# Patient Record
Sex: Female | Born: 1980 | Race: White | Hispanic: No | Marital: Married | Smoking: Former smoker
Health system: Southern US, Community
[De-identification: ages and names within clinical notes are randomized; demographics above are authoritative.]

## PROBLEM LIST (undated history)

## (undated) DIAGNOSIS — N63 Unspecified lump in unspecified breast: Secondary | ICD-10-CM

## (undated) DIAGNOSIS — R519 Headache, unspecified: Secondary | ICD-10-CM

## (undated) DIAGNOSIS — K219 Gastro-esophageal reflux disease without esophagitis: Secondary | ICD-10-CM

## (undated) DIAGNOSIS — N189 Chronic kidney disease, unspecified: Secondary | ICD-10-CM

## (undated) DIAGNOSIS — R87629 Unspecified abnormal cytological findings in specimens from vagina: Secondary | ICD-10-CM

## (undated) DIAGNOSIS — Z87442 Personal history of urinary calculi: Secondary | ICD-10-CM

## (undated) DIAGNOSIS — R87619 Unspecified abnormal cytological findings in specimens from cervix uteri: Secondary | ICD-10-CM

## (undated) DIAGNOSIS — M797 Fibromyalgia: Secondary | ICD-10-CM

## (undated) DIAGNOSIS — IMO0002 Reserved for concepts with insufficient information to code with codable children: Secondary | ICD-10-CM

## (undated) DIAGNOSIS — R51 Headache: Secondary | ICD-10-CM

## (undated) DIAGNOSIS — E039 Hypothyroidism, unspecified: Secondary | ICD-10-CM

## (undated) DIAGNOSIS — E890 Postprocedural hypothyroidism: Secondary | ICD-10-CM

## (undated) HISTORY — PX: TUBAL LIGATION: SHX77

## (undated) HISTORY — PX: BREAST BIOPSY: SHX20

## (undated) HISTORY — DX: Unspecified abnormal cytological findings in specimens from vagina: R87.629

---
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
## 2010-12-15 LAB — ABO/RH

## 2010-12-15 LAB — ANTIBODY SCREEN: Antibody Screen: NEGATIVE

## 2010-12-15 LAB — RUBELLA ANTIBODY, IGM: Rubella: IMMUNE

## 2011-04-01 LAB — RPR: RPR: NONREACTIVE

## 2011-04-01 LAB — HIV ANTIBODY (ROUTINE TESTING W REFLEX): HIV: NONREACTIVE

## 2011-06-23 ENCOUNTER — Inpatient Hospital Stay (HOSPITAL_COMMUNITY): Payer: BC Managed Care – PPO | Admitting: Anesthesiology

## 2011-06-23 ENCOUNTER — Encounter (HOSPITAL_COMMUNITY): Payer: Self-pay | Admitting: *Deleted

## 2011-06-23 ENCOUNTER — Inpatient Hospital Stay (HOSPITAL_COMMUNITY)
Admission: AD | Admit: 2011-06-23 | Discharge: 2011-06-25 | DRG: 373 | Disposition: A | Discharge status: Home / Self Care | Payer: BC Managed Care – PPO | Source: Ambulatory Visit | Attending: Obstetrics and Gynecology | Admitting: Obstetrics and Gynecology

## 2011-06-23 ENCOUNTER — Encounter (HOSPITAL_COMMUNITY): Payer: Self-pay | Admitting: Anesthesiology

## 2011-06-23 HISTORY — DX: Unspecified abnormal cytological findings in specimens from cervix uteri: R87.619

## 2011-06-23 HISTORY — DX: Headache, unspecified: R51.9

## 2011-06-23 HISTORY — DX: Reserved for concepts with insufficient information to code with codable children: IMO0002

## 2011-06-23 HISTORY — DX: Headache: R51

## 2011-06-23 LAB — CBC
HCT: 35.8 % — ABNORMAL LOW (ref 36.0–46.0)
MCH: 32.1 pg (ref 26.0–34.0)
MCV: 93.5 fL (ref 78.0–100.0)
Platelets: 211 10*3/uL (ref 150–400)
RBC: 3.83 MIL/uL — ABNORMAL LOW (ref 3.87–5.11)
WBC: 10.8 10*3/uL — ABNORMAL HIGH (ref 4.0–10.5)

## 2011-06-23 LAB — HIV ANTIBODY (ROUTINE TESTING W REFLEX): HIV: NONREACTIVE

## 2011-06-23 MED ORDER — PRENATAL PLUS 27-1 MG PO TABS
1.0000 | ORAL_TABLET | Freq: Every day | ORAL | Status: DC
  Administered 2011-06-24 – 2011-06-25 (×2): 1 via ORAL
  Filled 2011-06-23 (×2): qty 1

## 2011-06-23 MED ORDER — MEASLES, MUMPS & RUBELLA VAC ~~LOC~~ INJ: 0.5000 mL | INJECTION | Freq: Once | SUBCUTANEOUS | Status: DC

## 2011-06-23 MED ORDER — IBUPROFEN 600 MG PO TABS: 600.0000 mg | ORAL_TABLET | Freq: Four times a day (QID) | ORAL | Status: DC | PRN

## 2011-06-23 MED ORDER — OXYTOCIN 10 UNIT/ML IJ SOLN
INTRAMUSCULAR | Status: AC
  Filled 2011-06-23: qty 2

## 2011-06-23 MED ORDER — SENNOSIDES-DOCUSATE SODIUM 8.6-50 MG PO TABS
2.0000 | ORAL_TABLET | Freq: Every day | ORAL | Status: DC
  Administered 2011-06-24: 2 via ORAL

## 2011-06-23 MED ORDER — LACTATED RINGERS IV SOLN: 500.0000 mL | INTRAVENOUS | Status: DC | PRN

## 2011-06-23 MED ORDER — OXYCODONE-ACETAMINOPHEN 5-325 MG PO TABS
1.0000 | ORAL_TABLET | Freq: Four times a day (QID) | ORAL | Status: DC | PRN
  Filled 2011-06-23: qty 1

## 2011-06-23 MED ORDER — LACTATED RINGERS IV SOLN
INTRAVENOUS | Status: DC
  Administered 2011-06-23 (×3): via INTRAVENOUS

## 2011-06-23 MED ORDER — OXYTOCIN 20 UNITS IN LACTATED RINGERS INFUSION - SIMPLE
1.0000 m[IU]/min | INTRAVENOUS | Status: DC
  Administered 2011-06-23: 2 m[IU]/min via INTRAVENOUS
  Filled 2011-06-23: qty 1000

## 2011-06-23 MED ORDER — TETANUS-DIPHTH-ACELL PERTUSSIS 5-2.5-18.5 LF-MCG/0.5 IM SUSP
0.5000 mL | Freq: Once | INTRAMUSCULAR | Status: AC
  Administered 2011-06-24: 0.5 mL via INTRAMUSCULAR
  Filled 2011-06-23: qty 0.5

## 2011-06-23 MED ORDER — TERBUTALINE SULFATE 1 MG/ML IJ SOLN: 0.2500 mg | Freq: Once | INTRAMUSCULAR | Status: DC | PRN

## 2011-06-23 MED ORDER — BENZOCAINE-MENTHOL 20-0.5 % EX AERO
1.0000 "application " | INHALATION_SPRAY | CUTANEOUS | Status: DC | PRN
  Administered 2011-06-24: 1 via TOPICAL

## 2011-06-23 MED ORDER — OXYTOCIN 20 UNITS IN LACTATED RINGERS INFUSION - SIMPLE: 125.0000 mL/h | Freq: Once | INTRAVENOUS | Status: DC

## 2011-06-23 MED ORDER — ACETAMINOPHEN 325 MG PO TABS
650.0000 mg | ORAL_TABLET | ORAL | Status: DC | PRN
  Administered 2011-06-23: 650 mg via ORAL
  Filled 2011-06-23: qty 2

## 2011-06-23 MED ORDER — OXYCODONE-ACETAMINOPHEN 5-325 MG PO TABS
1.0000 | ORAL_TABLET | Freq: Four times a day (QID) | ORAL | Status: DC | PRN
  Administered 2011-06-23 – 2011-06-24 (×2): 1 via ORAL
  Filled 2011-06-23: qty 1

## 2011-06-23 MED ORDER — PHENYLEPHRINE 40 MCG/ML (10ML) SYRINGE FOR IV PUSH (FOR BLOOD PRESSURE SUPPORT): 80.0000 ug | PREFILLED_SYRINGE | INTRAVENOUS | Status: DC | PRN

## 2011-06-23 MED ORDER — LACTATED RINGERS IV SOLN: 500.0000 mL | Freq: Once | INTRAVENOUS | Status: DC

## 2011-06-23 MED ORDER — WITCH HAZEL-GLYCERIN EX PADS: 1.0000 "application " | MEDICATED_PAD | CUTANEOUS | Status: DC | PRN

## 2011-06-23 MED ORDER — LANOLIN HYDROUS EX OINT: TOPICAL_OINTMENT | CUTANEOUS | Status: DC | PRN

## 2011-06-23 MED ORDER — ONDANSETRON HCL 4 MG/2ML IJ SOLN: 4.0000 mg | Freq: Four times a day (QID) | INTRAMUSCULAR | Status: DC | PRN

## 2011-06-23 MED ORDER — LIDOCAINE HCL (PF) 1 % IJ SOLN
30.0000 mL | INTRAMUSCULAR | Status: DC | PRN
  Filled 2011-06-23: qty 30

## 2011-06-23 MED ORDER — OXYTOCIN BOLUS FROM INFUSION
500.0000 mL | Freq: Once | INTRAVENOUS | Status: DC
Start: 2011-06-23 — End: 2011-06-23

## 2011-06-23 MED ORDER — FLEET ENEMA 7-19 GM/118ML RE ENEM: 1.0000 | ENEMA | RECTAL | Status: DC | PRN

## 2011-06-23 MED ORDER — OXYCODONE-ACETAMINOPHEN 5-325 MG PO TABS: 2.0000 | ORAL_TABLET | ORAL | Status: DC | PRN

## 2011-06-23 MED ORDER — ZOLPIDEM TARTRATE 5 MG PO TABS: 5.0000 mg | ORAL_TABLET | Freq: Every evening | ORAL | Status: DC | PRN

## 2011-06-23 MED ORDER — ONDANSETRON HCL 4 MG/2ML IJ SOLN: 4.0000 mg | INTRAMUSCULAR | Status: DC | PRN

## 2011-06-23 MED ORDER — EPHEDRINE 5 MG/ML INJ: 10.0000 mg | INTRAVENOUS | Status: DC | PRN

## 2011-06-23 MED ORDER — IBUPROFEN 600 MG PO TABS
600.0000 mg | ORAL_TABLET | Freq: Four times a day (QID) | ORAL | Status: DC
  Administered 2011-06-24 – 2011-06-25 (×5): 600 mg via ORAL
  Filled 2011-06-23 (×6): qty 1

## 2011-06-23 MED ORDER — DIPHENHYDRAMINE HCL 50 MG/ML IJ SOLN: 12.5000 mg | INTRAMUSCULAR | Status: DC | PRN

## 2011-06-23 MED ORDER — ONDANSETRON HCL 4 MG PO TABS: 4.0000 mg | ORAL_TABLET | ORAL | Status: DC | PRN

## 2011-06-23 MED ORDER — SIMETHICONE 80 MG PO CHEW: 80.0000 mg | CHEWABLE_TABLET | ORAL | Status: DC | PRN

## 2011-06-23 MED ORDER — CITRIC ACID-SODIUM CITRATE 334-500 MG/5ML PO SOLN: 30.0000 mL | ORAL | Status: DC | PRN

## 2011-06-23 MED ORDER — DIPHENHYDRAMINE HCL 25 MG PO CAPS: 25.0000 mg | ORAL_CAPSULE | Freq: Four times a day (QID) | ORAL | Status: DC | PRN

## 2011-06-23 MED ORDER — FENTANYL 2.5 MCG/ML BUPIVACAINE 1/10 % EPIDURAL INFUSION (WH - ANES)
14.0000 mL/h | INTRAMUSCULAR | Status: DC
  Administered 2011-06-23 (×3): 14 mL/h via EPIDURAL
  Filled 2011-06-23 (×3): qty 60

## 2011-06-23 MED ORDER — DIBUCAINE 1 % RE OINT: 1.0000 "application " | TOPICAL_OINTMENT | RECTAL | Status: DC | PRN

## 2011-06-23 NOTE — H&P (Signed)
Melanie Rose is a 30 y.o. female G2P0101 at 38+ weeks (EDD 07/05/11 by LMP c/w 8 wek [place])  presenting forSROM around midnight.  Admitted with contractions and uncomfortable.  Received her epidural and made some change from 2-3 cm to 4+cm per RN.  Prenatal care uneventful.  Maternal Medical History:  Reason for admission: Reason for admission: rupture of membranes.  Contractions: Onset was 3-5 hours ago.   Frequency: irregular.   Perceived severity is moderate.      OB History    Grav Para Term Preterm Abortions TAB SAB Ect Mult Living   2 1 1  0      1     Past Medical History  Diagnosis Date  . Abnormal Pap smear   . Generalized headaches   . Hemorrhoids   . Constipation    No past surgical history on file. Family History: family history is not on file. Social History:  reports that she quit smoking about 2 years ago. She does not have any smokeless tobacco history on file. She reports that she does not drink alcohol or use illicit drugs.  ROS  Dilation: 4.5 Effacement (%): 50 Station: -2 Exam by:: A.Davis,RN  Blood pressure 111/65, pulse 90, temperature 98.3 F (36.8 C), temperature source Oral, resp. rate 18, height 4\' 11"  (1.499 m), weight 83.462 kg (184 lb). Maternal Exam:  Uterine Assessment: Contraction strength is moderate.  Contraction frequency is regular.   Abdomen: Patient reports no abdominal tenderness. Fetal presentation: vertex  Introitus: Normal vulva. Normal vagina.    Physical Exam  Constitutional: She is oriented to person, place, and time. She appears well-developed.  Cardiovascular: Normal rate and regular rhythm.   Respiratory: Effort normal and breath sounds normal.  GI: Soft. Bowel sounds are normal.  Neurological: She is alert and oriented to person, place, and time.  Psychiatric: She has a normal mood and affect. Her behavior is normal.    Prenatal labs: ABO, Rh:   Antibody:   Rubella:   RPR: Nonreactive (07/26 0000)  HBsAg:    HIV:  Non-reactive (07/26 0000)  GBS: Negative (10/01 0000)  First trimester screen WNL AFP WNL One hour glucola 99  Assessment/Plan: Pt admitted and progressing slowly so will start pitocin augmentation.  Lab flow sheet is not in prenatals, so will call office to get. FHR reassuring.   Oliver Pila 06/23/2011, 7:23 AM

## 2011-06-23 NOTE — Anesthesia Procedure Notes (Signed)
Epidural   Additional Notes See paper documentation completed during Epic downtime by Dr, Malen Gauze.

## 2011-06-23 NOTE — Progress Notes (Signed)
Pt  Became active in labor at 3 PM with the pitocin at 10 mu/minute. At 5:30 pm she was 8 cm. She progressed to full dilatation and pushed well but made slow descent. She delivered a living female infant 8 pounds 3 ounces with Apgars of 9 at 1 minute and 9 at 5 minutes. There was shoulder dystocia that was managed by woodscrew and suprapubic pressure.The FOB was in the room but he was unable to assist with McRoberts because of aversion to the situation. The shoulder dystocia persisted for 30 seconds. There were 3 lacerations. A right of midline second degree, a right introital at 10 o'clock, and a left vaginal close to the urethra. All were bleeding and repaired with 3-0 vicryl.EBL 400cc's. The placenta was removed intact and the uterus was normal.

## 2011-06-23 NOTE — Anesthesia Preprocedure Evaluation (Addendum)
Anesthesia Evaluation Anesthesia Physical Anesthesia Plan  ASA: II  Anesthesia Plan: Epidural   Post-op Pain Management:    Induction:   Airway Management Planned:   Additional Equipment:   Intra-op Plan:   Post-operative Plan:   Informed Consent:   Plan Discussed with:   Anesthesia Plan Comments: (Eval completed on paper during epic downtime by Dr. Malen Gauze.)       Anesthesia Quick Evaluation

## 2011-06-24 LAB — CBC
HCT: 33.6 % — ABNORMAL LOW (ref 36.0–46.0)
Platelets: 203 10*3/uL (ref 150–400)
RBC: 3.58 MIL/uL — ABNORMAL LOW (ref 3.87–5.11)
RDW: 12.6 % (ref 11.5–15.5)
WBC: 15.7 10*3/uL — ABNORMAL HIGH (ref 4.0–10.5)

## 2011-06-24 MED ORDER — BENZOCAINE-MENTHOL 20-0.5 % EX AERO
INHALATION_SPRAY | CUTANEOUS | Status: AC
  Administered 2011-06-24: 1 via TOPICAL
  Filled 2011-06-24: qty 56

## 2011-06-24 NOTE — Anesthesia Postprocedure Evaluation (Signed)
  Anesthesia Post-op Note  Patient: Melanie Rose  Procedure(s) Performed: * No procedures listed *  Patient Location: PACU and Mother/Baby  Anesthesia Type: Epidural  Level of Consciousness: awake, alert , oriented and patient cooperative  Airway and Oxygen Therapy: Patient Spontanous Breathing  Post-op Pain: none  Post-op Assessment: Post-op Vital signs reviewed and Patient's Cardiovascular Status Stable  Post-op Vital Signs: Reviewed and stable  Complications: No apparent anesthesia complications

## 2011-06-24 NOTE — Progress Notes (Signed)
#  1 afebrile this am. Temp 100.0 last night. Will observe for now. Pt feels fine. HGB 12.3 to 11.6.

## 2011-06-25 MED ORDER — OXYCODONE-ACETAMINOPHEN 5-325 MG PO TABS: 1.0000 | ORAL_TABLET | Freq: Four times a day (QID) | ORAL | Status: AC | PRN

## 2011-06-25 NOTE — Progress Notes (Signed)
#  2 afebrile BP normal for D/C. 

## 2011-06-25 NOTE — Discharge Summary (Signed)
NAMELAKEYA, MULKA NO.:  [id number]  MEDICAL RECORD NO.:  [id number]  LOCATION:  9124                          FACILITY:  [facility]  PHYSICIAN:  Malachi Pro. Ambrose Mantle, M.D. DATE OF BIRTH:  [birth year]  DATE OF ADMISSION:  06/23/2011 DATE OF DISCHARGE:                              DISCHARGE SUMMARY   This is a 30 year old white female, para 0-1-0-1, gravida 2 at 38+ weeks gestation with [facility] July 05, 2011 by last period compatible with an 8- week ultrasound, presented with spontaneous rupture of membranes.  She was admitted with contractions and she was uncomfortable.  She received an epidural and made some change from 2-3 cm to 4 cm per the RN.  Her blood group and type was not available at the time of admission. Rubella was not available.  RPR nonreactive.  Hepatitis B surface antigen not available.  HIV nonreactive.  Group B-strep negative.  First trimester screen normal.  AFP normal.  1-hour Glucola was 99.  After admission to the hospital, the patient remained in labor and so Pitocin was started.  She became active in labor at approximately 3 p.m. with a Pitocin at 10 milliunits a minute.  At 5:30 p.m., she was 8 cm.  She progressed to full dilatation and pushed well but made slow descend. She delivered a living female infant 8 pounds 3 ounces with Apgars of 9 at 1 and 9 at 5 minutes.  There was shoulder dystocia that was managed by [facility] and suprapubic pressure.  The father of the baby was in the room, but he was unable to assist with McRoberts maneuver because of aversion to the situation.  A shoulder dystocia persisted for 30 seconds.  There were 3 lacerations, a right of midline 2nd degree, a right introital at 10 o'clock, and left vaginal close to the urethra. All were bleeding and repaired with 3-0 Vicryl.  Blood loss about 400 mL.  The placenta was removed intact and the uterus was normal. Postpartum, the patient did well, although she did have a fever of  100.0 degrees on the night of delivery.  She never became febrile again and on the 2nd postpartum day, she was ready for discharge.  Her initial hemoglobin was 12.3, hematocrit 35.8, white count 10,800, platelet count 211,000.  Followup hemoglobin was 11.6, hematocrit 33.6, platelet count 203,000.  FINAL DIAGNOSES: 1. Intrauterine pregnancy at 38 weeks with premature rupture of the     membranes. 2. Shoulder dystocia.  OPERATION:  Spontaneous delivery, vertex, repair of perineal lacerations  CONDITION:  Improved.  INSTRUCTIONS:  Include the regular discharge instruction booklet.  She is advised to return to the office in 6 weeks for followup examination. A prescription for 5/325, 20 tablets, 1 every 4-6 hours as needed for pain will be given at discharge.  I will check on the labs and see if there is anything that requires further evaluation.     Malachi Pro. Ambrose Mantle, M.D.     TFH/MEDQ  D:  06/25/2011  T:  06/25/2011  Job:  [id number]

## 2013-06-12 ENCOUNTER — Ambulatory Visit: Payer: BC Managed Care – PPO | Admitting: Internal Medicine

## 2014-04-10 DIAGNOSIS — M359 Systemic involvement of connective tissue, unspecified: Secondary | ICD-10-CM | POA: Insufficient documentation

## 2014-04-10 DIAGNOSIS — M2550 Pain in unspecified joint: Secondary | ICD-10-CM | POA: Insufficient documentation

## 2014-04-10 DIAGNOSIS — R768 Other specified abnormal immunological findings in serum: Secondary | ICD-10-CM | POA: Insufficient documentation

## 2014-04-24 DIAGNOSIS — M797 Fibromyalgia: Secondary | ICD-10-CM | POA: Insufficient documentation

## 2014-07-08 ENCOUNTER — Encounter (HOSPITAL_COMMUNITY): Payer: Self-pay | Admitting: *Deleted

## 2014-12-01 ENCOUNTER — Other Ambulatory Visit (HOSPITAL_COMMUNITY): Payer: Self-pay | Admitting: Obstetrics and Gynecology

## 2015-01-08 NOTE — Anesthesia Preprocedure Evaluation (Addendum)
Anesthesia Evaluation  Patient identified by MRN, date of birth, ID band Patient awake    Reviewed: Allergy & Precautions, H&P , Patient's Chart, lab work & pertinent test results, reviewed documented beta blocker date and time   Airway Mallampati: II  TM Distance: >3 FB Neck ROM: full    Dental no notable dental hx.    Pulmonary former smoker,  breath sounds clear to auscultation  Pulmonary exam normal       Cardiovascular Rhythm:regular Rate:Normal     Neuro/Psych    GI/Hepatic   Endo/Other    Renal/GU      Musculoskeletal   Abdominal   Peds  Hematology   Anesthesia Other Findings   Reproductive/Obstetrics                            Anesthesia Physical Anesthesia Plan  ASA: II  Anesthesia Plan: General   Post-op Pain Management:    Induction: Intravenous  Airway Management Planned: Oral ETT  Additional Equipment:   Intra-op Plan:   Post-operative Plan: Extubation in OR  Informed Consent: I have reviewed the patients History and Physical, chart, labs and discussed the procedure including the risks, benefits and alternatives for the proposed anesthesia with the patient or authorized representative who has indicated his/her understanding and acceptance.   Dental Advisory Given and Dental advisory given  Plan Discussed with: CRNA and Surgeon  Anesthesia Plan Comments: (Sl. Limited mouth opening ; may need light wand  Discussed general anesthesia, including possible nausea, instrumentation of airway, sore throat,pulmonary aspiration, etc. I asked if the were any outstanding questions, or  concerns before we proceeded. )       Anesthesia Quick Evaluation

## 2015-01-14 ENCOUNTER — Encounter (HOSPITAL_COMMUNITY)
Admission: RE | Admit: 2015-01-14 | Discharge: 2015-01-14 | Disposition: A | Discharge status: Home / Self Care | Payer: BLUE CROSS/BLUE SHIELD | Source: Ambulatory Visit | Attending: Obstetrics and Gynecology | Admitting: Obstetrics and Gynecology

## 2015-01-14 ENCOUNTER — Encounter (HOSPITAL_COMMUNITY): Payer: Self-pay

## 2015-01-14 DIAGNOSIS — Z87891 Personal history of nicotine dependence: Secondary | ICD-10-CM | POA: Diagnosis not present

## 2015-01-14 DIAGNOSIS — Z302 Encounter for sterilization: Secondary | ICD-10-CM | POA: Diagnosis present

## 2015-01-14 HISTORY — DX: Gastro-esophageal reflux disease without esophagitis: K21.9

## 2015-01-14 HISTORY — DX: Fibromyalgia: M79.7

## 2015-01-14 HISTORY — DX: Chronic kidney disease, unspecified: N18.9

## 2015-01-14 HISTORY — DX: Hypothyroidism, unspecified: E03.9

## 2015-01-14 LAB — URINALYSIS, ROUTINE W REFLEX MICROSCOPIC
Bilirubin Urine: NEGATIVE
GLUCOSE, UA: NEGATIVE mg/dL
KETONES UR: NEGATIVE mg/dL
LEUKOCYTES UA: NEGATIVE
Nitrite: NEGATIVE
Protein, ur: NEGATIVE mg/dL
Specific Gravity, Urine: 1.015 (ref 1.005–1.030)
Urobilinogen, UA: 0.2 mg/dL (ref 0.0–1.0)
pH: 6.5 (ref 5.0–8.0)

## 2015-01-14 LAB — CBC
HCT: 41.4 % (ref 36.0–46.0)
Hemoglobin: 14.5 g/dL (ref 12.0–15.0)
MCH: 32 pg (ref 26.0–34.0)
MCHC: 35 g/dL (ref 30.0–36.0)
MCV: 91.4 fL (ref 78.0–100.0)
PLATELETS: 264 10*3/uL (ref 150–400)
RBC: 4.53 MIL/uL (ref 3.87–5.11)
RDW: 12.2 % (ref 11.5–15.5)
WBC: 6.2 10*3/uL (ref 4.0–10.5)

## 2015-01-14 LAB — COMPREHENSIVE METABOLIC PANEL
ALK PHOS: 70 U/L (ref 38–126)
ALT: 15 U/L (ref 14–54)
ANION GAP: 6 (ref 5–15)
AST: 17 U/L (ref 15–41)
Albumin: 4.4 g/dL (ref 3.5–5.0)
BILIRUBIN TOTAL: 0.2 mg/dL — AB (ref 0.3–1.2)
BUN: 9 mg/dL (ref 6–20)
CO2: 28 mmol/L (ref 22–32)
Calcium: 9.7 mg/dL (ref 8.9–10.3)
Chloride: 105 mmol/L (ref 101–111)
Creatinine, Ser: 0.87 mg/dL (ref 0.44–1.00)
GFR calc Af Amer: >60 mL/min (ref >60)
Glucose, Bld: 99 mg/dL (ref 70–99)
Potassium: 4 mmol/L (ref 3.5–5.1)
Sodium: 139 mmol/L (ref 135–145)
Total Protein: 8.2 g/dL — ABNORMAL HIGH (ref 6.5–8.1)

## 2015-01-14 LAB — URINE MICROSCOPIC-ADD ON

## 2015-01-14 NOTE — Patient Instructions (Signed)
Your procedure is scheduled on:01/16/15  Enter through the Main Entrance at :6am Pick up desk phone and dial [phone] and inform us of your arrival.  Please call [phone] if you have any problems the morning of surgery.  Remember: Do not eat food or drink liquids, including water, after midnight:    You may brush your teeth the morning of surgery.  Take these meds the morning of surgery with a sip of water: Thyroid pill  DO NOT wear jewelry, eye make-up, lipstick,body lotion, or dark fingernail polish.  (Polished toes are ok) You may wear deodorant.  If you are to be admitted after surgery, leave suitcase in car until your room has been assigned. Patients discharged on the day of surgery will not be allowed to drive home. Wear loose fitting, comfortable clothes for your ride home.

## 2015-01-14 NOTE — H&P (Signed)
Melanie Rose, Melanie Rose NO.:  [id number]  MEDICAL RECORD NO.:  [id number]  LOCATION:  [facility]C                           FACILITY:  [place]  PHYSICIAN:  Lucille Passy. Ulanda Edison, M.D. DATE OF BIRTH:  10-08-1980  DATE OF ADMISSION:  01/14/2015 DATE OF DISCHARGE:                             HISTORY & PHYSICAL   PRESENT ILLNESS:  This is a 34 year old white female, para 2-0-2, who was admitted for sterilization by laparoscopy.  She understands the failure rate, probably of 1 in a 100.  She understands that she should consider this a permanent procedure.  She has been offered Essure in our office and declines.  Her periods are regular on birth control pills. She has not taken an active birth control pill for 1 week.  PAST MEDICAL HISTORY:  No known medical illnesses.  SURGICAL HISTORY:  None.  She did have a thyroid nodule onset in 2014.  ALLERGIES:  She has no known drug allergies.  SOCIAL HISTORY:  She is a former smoker.  Does not drink.  Does not take drugs.  Has 12th grade of education, is a Materials engineer.  She is married.  FAMILY HISTORY:  Maternal grandmother with disorder of the thyroid gland.  Maternal grandfather, heart disease.  MEDICATIONS:  The patient takes 88 mcg of levothyroxine a day.  She has been on Tri-Previfem.  PHYSICAL EXAMINATION:  GENERAL:  Well developed, obese white female, in no distress.  She is a healthy-appearing white female, in no distress.  VITAL SIGNS:  She is 5 feet tall, 190 pounds, has a BMI of 37.1, her blood pressure is 110/70, and pulse is 72.  EYES, NOSE, and THROAT:  Normal.  Thyroid, there is no appreciable nodule present.  LUNGS:  Clear to auscultation.  HEART:  Normal size and sounds.  No murmurs.  BREASTS:  Large without masses.  ABDOMEN:  Soft and nontender.  It is obese.  No masses are palpable. The vulva and vagina are clean.  The cervix is clean.  Uterus is retroverted, normal size.  The right ovary is palpated as a  normal-sized ovary, and the cul-de-sac beside the uterus.  ADMITTING IMPRESSION:  Voluntary sterilization, hypothyroidism.  The patient is admitted for laparoscopic tubal cauterization.  She requested, if I cannot enter the abdominal cavity by laparoscopy, she wants her tubes tied through a mini-lap incision.     Lucille Passy. Ulanda Edison, M.D.     TFH/MEDQ  D:  01/14/2015  T:  01/14/2015  Job:  [id number]

## 2015-01-16 ENCOUNTER — Ambulatory Visit (HOSPITAL_COMMUNITY): Payer: BLUE CROSS/BLUE SHIELD | Admitting: Anesthesiology

## 2015-01-16 ENCOUNTER — Encounter (HOSPITAL_COMMUNITY)
Admission: RE | Disposition: A | Discharge status: Home / Self Care | Payer: Self-pay | Source: Ambulatory Visit | Attending: Obstetrics and Gynecology

## 2015-01-16 ENCOUNTER — Ambulatory Visit (HOSPITAL_COMMUNITY)
Admission: RE | Admit: 2015-01-16 | Discharge: 2015-01-16 | Disposition: A | Discharge status: Home / Self Care | Payer: BLUE CROSS/BLUE SHIELD | Source: Ambulatory Visit | Attending: Obstetrics and Gynecology | Admitting: Obstetrics and Gynecology

## 2015-01-16 DIAGNOSIS — Z302 Encounter for sterilization: Secondary | ICD-10-CM | POA: Insufficient documentation

## 2015-01-16 DIAGNOSIS — Z87891 Personal history of nicotine dependence: Secondary | ICD-10-CM | POA: Insufficient documentation

## 2015-01-16 HISTORY — PX: LAPAROSCOPIC TUBAL LIGATION: SHX1937

## 2015-01-16 LAB — PREGNANCY, URINE: Preg Test, Ur: NEGATIVE

## 2015-01-16 SURGERY — LIGATION, FALLOPIAN TUBE, LAPAROSCOPIC
Anesthesia: General | Site: Abdomen | Laterality: Bilateral

## 2015-01-16 MED ORDER — ACETAMINOPHEN 160 MG/5ML PO SOLN
ORAL | Status: AC
  Filled 2015-01-16: qty 20.3

## 2015-01-16 MED ORDER — LACTATED RINGERS IV SOLN
INTRAVENOUS | Status: DC
  Administered 2015-01-16 (×2): via INTRAVENOUS

## 2015-01-16 MED ORDER — MIDAZOLAM HCL 2 MG/2ML IJ SOLN
INTRAMUSCULAR | Status: DC | PRN
  Administered 2015-01-16: 2 mg via INTRAVENOUS

## 2015-01-16 MED ORDER — ROCURONIUM BROMIDE 100 MG/10ML IV SOLN
INTRAVENOUS | Status: DC | PRN
  Administered 2015-01-16: 40 mg via INTRAVENOUS

## 2015-01-16 MED ORDER — KETOROLAC TROMETHAMINE 30 MG/ML IJ SOLN
INTRAMUSCULAR | Status: DC | PRN
  Administered 2015-01-16: 30 mg via INTRAVENOUS

## 2015-01-16 MED ORDER — FENTANYL CITRATE (PF) 100 MCG/2ML IJ SOLN: 25.0000 ug | INTRAMUSCULAR | Status: DC | PRN

## 2015-01-16 MED ORDER — KETOROLAC TROMETHAMINE 30 MG/ML IJ SOLN
INTRAMUSCULAR | Status: AC
  Filled 2015-01-16: qty 1

## 2015-01-16 MED ORDER — DEXAMETHASONE SODIUM PHOSPHATE 4 MG/ML IJ SOLN
INTRAMUSCULAR | Status: AC
  Filled 2015-01-16: qty 1

## 2015-01-16 MED ORDER — FENTANYL CITRATE (PF) 100 MCG/2ML IJ SOLN
INTRAMUSCULAR | Status: AC
  Filled 2015-01-16: qty 2

## 2015-01-16 MED ORDER — NEOSTIGMINE METHYLSULFATE 10 MG/10ML IV SOLN
INTRAVENOUS | Status: DC | PRN
  Administered 2015-01-16: 3 mg via INTRAVENOUS

## 2015-01-16 MED ORDER — SCOPOLAMINE 1 MG/3DAYS TD PT72: 1.0000 | MEDICATED_PATCH | Freq: Once | TRANSDERMAL | Status: DC

## 2015-01-16 MED ORDER — HYDROCODONE-ACETAMINOPHEN 5-300 MG PO TABS: 1.0000 | ORAL_TABLET | Freq: Three times a day (TID) | ORAL | Status: DC | PRN

## 2015-01-16 MED ORDER — NEOSTIGMINE METHYLSULFATE 10 MG/10ML IV SOLN
INTRAVENOUS | Status: AC
  Filled 2015-01-16: qty 1

## 2015-01-16 MED ORDER — FENTANYL CITRATE (PF) 250 MCG/5ML IJ SOLN
INTRAMUSCULAR | Status: AC
  Filled 2015-01-16: qty 5

## 2015-01-16 MED ORDER — PROPOFOL 10 MG/ML IV BOLUS
INTRAVENOUS | Status: AC
  Filled 2015-01-16: qty 20

## 2015-01-16 MED ORDER — HYDROCODONE-ACETAMINOPHEN 5-325 MG PO TABS
ORAL_TABLET | ORAL | Status: AC
  Filled 2015-01-16: qty 1

## 2015-01-16 MED ORDER — PROPOFOL 10 MG/ML IV BOLUS
INTRAVENOUS | Status: DC | PRN
  Administered 2015-01-16: 200 mg via INTRAVENOUS

## 2015-01-16 MED ORDER — FENTANYL CITRATE (PF) 100 MCG/2ML IJ SOLN
25.0000 ug | INTRAMUSCULAR | Status: DC | PRN
  Administered 2015-01-16: 50 ug via INTRAVENOUS
  Administered 2015-01-16: 25 ug via INTRAVENOUS

## 2015-01-16 MED ORDER — ONDANSETRON HCL 4 MG/2ML IJ SOLN
INTRAMUSCULAR | Status: AC
  Filled 2015-01-16: qty 2

## 2015-01-16 MED ORDER — ACETAMINOPHEN 160 MG/5ML PO SOLN: 975.0000 mg | Freq: Once | ORAL | Status: DC

## 2015-01-16 MED ORDER — MIDAZOLAM HCL 2 MG/2ML IJ SOLN
INTRAMUSCULAR | Status: AC
  Filled 2015-01-16: qty 2

## 2015-01-16 MED ORDER — HYDROCODONE-ACETAMINOPHEN 5-325 MG PO TABS
1.0000 | ORAL_TABLET | Freq: Once | ORAL | Status: AC
  Administered 2015-01-16: 1 via ORAL

## 2015-01-16 MED ORDER — FENTANYL CITRATE (PF) 100 MCG/2ML IJ SOLN
INTRAMUSCULAR | Status: DC | PRN
  Administered 2015-01-16 (×2): 50 ug via INTRAVENOUS
  Administered 2015-01-16: 100 ug via INTRAVENOUS

## 2015-01-16 MED ORDER — LIDOCAINE HCL (CARDIAC) 20 MG/ML IV SOLN
INTRAVENOUS | Status: DC | PRN
  Administered 2015-01-16: 100 mg via INTRAVENOUS

## 2015-01-16 MED ORDER — DEXAMETHASONE SODIUM PHOSPHATE 10 MG/ML IJ SOLN
INTRAMUSCULAR | Status: DC | PRN
Start: 2015-01-16 — End: 2015-01-16
  Administered 2015-01-16: 4 mg via INTRAVENOUS

## 2015-01-16 MED ORDER — LIDOCAINE HCL (CARDIAC) 20 MG/ML IV SOLN
INTRAVENOUS | Status: AC
  Filled 2015-01-16: qty 5

## 2015-01-16 MED ORDER — GLYCOPYRROLATE 0.2 MG/ML IJ SOLN
INTRAMUSCULAR | Status: AC
  Filled 2015-01-16: qty 3

## 2015-01-16 MED ORDER — GLYCOPYRROLATE 0.2 MG/ML IJ SOLN
INTRAMUSCULAR | Status: DC | PRN
  Administered 2015-01-16: 0.6 mg via INTRAVENOUS

## 2015-01-16 MED ORDER — ONDANSETRON HCL 4 MG/2ML IJ SOLN
INTRAMUSCULAR | Status: DC | PRN
  Administered 2015-01-16: 4 mg via INTRAVENOUS

## 2015-01-16 MED ORDER — ACETAMINOPHEN 160 MG/5ML PO SOLN
975.0000 mg | Freq: Once | ORAL | Status: AC
  Administered 2015-01-16: 975 mg via ORAL

## 2015-01-16 MED ORDER — ROCURONIUM BROMIDE 100 MG/10ML IV SOLN
INTRAVENOUS | Status: AC
  Filled 2015-01-16: qty 1

## 2015-01-16 SURGICAL SUPPLY — 24 items
BLADE SURG 15 STRL LF C SS BP (BLADE) ×1 IMPLANT
BLADE SURG 15 STRL SS (BLADE) ×2
CATH ROBINSON RED A/P 16FR (CATHETERS) ×3 IMPLANT
CLOSURE WOUND 1/2 X4 (GAUZE/BANDAGES/DRESSINGS) ×1
CLOSURE WOUND 1/4 X3 (GAUZE/BANDAGES/DRESSINGS) ×1
CLOTH BEACON ORANGE TIMEOUT ST (SAFETY) ×3 IMPLANT
DRSG COVADERM PLUS 2X2 (GAUZE/BANDAGES/DRESSINGS) ×6 IMPLANT
DRSG OPSITE POSTOP 3X4 (GAUZE/BANDAGES/DRESSINGS) IMPLANT
GLOVE BIO SURGEON STRL SZ7.5 (GLOVE) ×6 IMPLANT
GOWN STRL REUS W/TWL LRG LVL3 (GOWN DISPOSABLE) ×6 IMPLANT
PACK LAPAROSCOPY BASIN (CUSTOM PROCEDURE TRAY) ×3 IMPLANT
PAD POSITIONER PINK NONSTERILE (MISCELLANEOUS) ×3 IMPLANT
STRIP CLOSURE SKIN 1/2X4 (GAUZE/BANDAGES/DRESSINGS) ×2 IMPLANT
STRIP CLOSURE SKIN 1/4X3 (GAUZE/BANDAGES/DRESSINGS) ×2 IMPLANT
SUT PDS AB 0 CT 36 (SUTURE) IMPLANT
SUT PLAIN 3 0 FS2 (SUTURE) ×3 IMPLANT
SUT VIC AB 3-0 CTX 36 (SUTURE) ×3 IMPLANT
SUT VICRYL 0 UR6 27IN ABS (SUTURE) ×9 IMPLANT
TOWEL OR 17X24 6PK STRL BLUE (TOWEL DISPOSABLE) ×6 IMPLANT
TROCAR BALLN 12MMX100 BLUNT (TROCAR) ×3 IMPLANT
TROCAR OPTI TIP 5M 100M (ENDOMECHANICALS) ×3 IMPLANT
TROCAR XCEL DIL TIP R 11M (ENDOMECHANICALS) IMPLANT
TROCAR XCEL NON-BLD 11X100MML (ENDOMECHANICALS) ×3 IMPLANT
WATER STERILE IRR 1000ML POUR (IV SOLUTION) ×3 IMPLANT

## 2015-01-16 NOTE — Progress Notes (Signed)
Patient ID: Melanie Rose, female   DOB: Jan 31, 1981, 34 y.o.   MRN: [id number] This pt was examined 01-14-15 and she reports no change in her health since that time.

## 2015-01-16 NOTE — Anesthesia Procedure Notes (Signed)
Procedure Name: Intubation Date/Time: 01/16/2015 7:23 AM Performed by: Hewitt Blade Pre-anesthesia Checklist: Patient identified, Patient being monitored, Emergency Drugs available and Suction available Patient Re-evaluated:Patient Re-evaluated prior to inductionOxygen Delivery Method: Circle system utilized Preoxygenation: Pre-oxygenation with 100% oxygen Intubation Type: IV induction Ventilation: Mask ventilation without difficulty and Oral airway inserted - appropriate to patient size Tube size: 7.0 mm Number of attempts: 2 Airway Equipment and Method: Lighted stylet Placement Confirmation: positive ETCO2 and breath sounds checked- equal and bilateral Secured at: 20 cm Tube secured with: Tape Dental Injury: Teeth and Oropharynx as per pre-operative assessment

## 2015-01-16 NOTE — Transfer of Care (Signed)
Immediate Anesthesia Transfer of Care Note  Patient: Melanie Rose  Procedure(s) Performed: Procedure(s): LAPAROSCOPIC TUBAL LIGATION (Bilateral)  Patient Location: PACU  Anesthesia Type:General  Level of Consciousness: awake, alert  and sedated  Airway & Oxygen Therapy: Patient Spontanous Breathing and Patient connected to nasal cannula oxygen  Post-op Assessment: Report given to RN, Post -op Vital signs reviewed and stable and Patient moving all extremities  Post vital signs: Reviewed and stable  Last Vitals:  Filed Vitals:   01/16/15 0612  BP: 115/75  Pulse: 85  Temp: 36.8 C  Resp: 20    Complications: No apparent anesthesia complications

## 2015-01-16 NOTE — Anesthesia Postprocedure Evaluation (Signed)
  Anesthesia Post-op Note  Patient: Melanie Rose  Procedure(s) Performed: Procedure(s): LAPAROSCOPIC TUBAL LIGATION (Bilateral) Patient is awake and responsive. Pain and nausea are reasonably well controlled. Vital signs are stable and clinically acceptable. Oxygen saturation is clinically acceptable. There are no apparent anesthetic complications at this time. Patient is ready for discharge.

## 2015-01-16 NOTE — Op Note (Signed)
Melanie Rose, Melanie Rose NO.:  [id number]  MEDICAL RECORD NO.:  [id number]  LOCATION:  [facility]                          FACILITY:  [place]  PHYSICIAN:  Lucille Passy. Ulanda Edison, M.D. DATE OF BIRTH:  01-23-1981  DATE OF PROCEDURE:  01/16/2015 DATE OF DISCHARGE:                              OPERATIVE REPORT   PREOPERATIVE DIAGNOSIS:  Voluntary sterilization.  POSTOPERATIVE DIAGNOSIS:  Voluntary sterilization.  OPERATION:  Laparoscopic tubal cauterization.  OPERATOR:  Lucille Passy. Ulanda Edison, MD  ANESTHESIA:  General anesthesia.  DESCRIPTION OF PROCEDURE:  The patient was brought to the operating room and intubation was difficult.  The nurse anesthetist tried to intubate and was unsuccessful.  Dr. Lyndle Herrlich then took over and used the method without the laryngoscope.  He tried twice unsuccessfully and then was able to intubate without difficulty on the third try.  A time-out was then done identifying the patient and the procedure to be done.  The vulva, vagina, and perineum were prepped with Betadine solution, and the patient was placed in [place].  The urethra was prepped.  The bladder was emptied with a [place] catheter.  The cervix was visualized and a Hulka cannula was placed into the uterus and attached to the posterior cervical lip.  The uterus was completely retroverted and the right ovary could be felt posterior to the uterus in the cul-de-sac. The abdomen was then draped as a sterile field after the abdomen was prepped with Betadine solution.  The lower aspect of the umbilicus was used to make an incision about an inch wide.  It was carried down to the fascia.  The fascia was grasped, incised, and the peritoneum was opened bluntly.  I used a pursestring suture of 0 Vicryl to pursestring the fascial opening, placed Hasson cannula and did not get a seal.  In doing so the sutures tore at the place where they were secured on to the Hasson cannula.  I repeated this step  again and had the same result both from the leaking of the gas and from the sutures tearing at the site of their attachment to the Hasson cannula.  We switched to a disposable with a balloon and had no problem.  Pneumoperitoneum was created.  A smaller trocar was inserted suprapubically under direct vision.  The patient was placed in Trendelenburg position.  The posterior cul-de-sac, the uterus and anterior cul-de-sac were normal.  The right tube and ovary were normal.  The right ovary was in the cul-de-sac but it was not adherent anyway, had a smooth surface, could possibly represent a PCOS. The left ovary was completely normal.  I used a Kleppinger forceps, the first forceps that were given to me would not meet, so it was impossible to hold the tube in between the grasping parts of the Kleppinger forceps.  We switched to another one.  It was angled in the wrong direction, so it was corrected and then the procedure proceeded.  I started at the midportion of the right tube and cauterized in 3 consecutive locations toward the cornu of the uterus, repeated the procedure on the left side.  At all times, the bowel was kept  well free of the operative field.  After the procedure was completed, I looked at the upper abdomen and found no problems.  I reinspected the pelvis, it was normal.  I removed the lower trocar under direct vision, there was no bleeding.  I then released the pneumoperitoneum, removed the Hasson cannula and balloon.  I removed the Vicryl suture from the umbilical opening, and closed the umbilical fascial defect with 3 interrupted figure-of-eight sutures of 0 PDS.  The subcutaneous tissue was closed with 3-0 Vicryl, and the skin was reapproximated with 3-0 plain catgut. The lower incision was reapproximated with Steri-Strips.  I removed the Hulka cannula and inspected the posterior lip grasping site, it was bleeding some, I held pressure for 2 minutes, the bleeding was  minimal, and the procedure was terminated.  The patient seemed to tolerate the procedure well, was returned to recovery in satisfactory condition.  Blood loss about 10 mL.     Lucille Passy. Ulanda Edison, M.D.     TFH/MEDQ  D:  01/16/2015  T:  01/16/2015  Job:  [id number]

## 2015-01-16 NOTE — Discharge Instructions (Signed)
No vaginal entrance x 1 week, use back up contraceptive for 2 weeks, call with temp > 100.4 degrees,or any problem.   DISCHARGE INSTRUCTIONS: Laparoscopy  MAY TAKE IBUPROFEN (MOTRIN, ADVIL) OR ALEVE AFTER 2:25 PM FOR CRAMPS!! DRESSINGS MAY BE REMOVED IN 48 HOURS!!  The following instructions have been prepared to help you care for yourself upon your return home today.  Wound care:  Do not get the incision wet for the first 24 hours. The incision should be kept clean and dry.  The Band-Aids or dressings may be removed the day after surgery.  Should the incision become sore, red, and swollen after the first week, check with your doctor.  Personal hygiene:  Shower the day after your procedure.  Activity and limitations:  Do NOT drive or operate any equipment today.  Do NOT lift anything more than 15 pounds for 2-3 weeks after surgery.  Do NOT rest in bed all day.  Walking is encouraged. Walk each day, starting slowly with 5-minute walks 3 or 4 times a day. Slowly increase the length of your walks.  Walk up and down stairs slowly.  Do NOT do strenuous activities, such as golfing, playing tennis, bowling, running, biking, weight lifting, gardening, mowing, or vacuuming for 2-4 weeks. Ask your doctor when it is okay to start.  Diet: Eat a light meal as desired this evening. You may resume your usual diet tomorrow.  Return to work: This is dependent on the type of work you do. For the most part you can return to a desk job within a week of surgery. If you are more active at work, please discuss this with your doctor.  What to expect after your surgery: You may have a slight burning sensation when you urinate on the first day. You may have a very small amount of blood in the urine. Expect to have a small amount of vaginal discharge/light bleeding for 1-2 weeks. It is not unusual to have abdominal soreness and bruising for up to 2 weeks. You may be tired and need more rest for about 1  week. You may experience shoulder pain for 24-72 hours. Lying flat in bed may relieve it.  Call your doctor for any of the following:  Develop a fever of 100.4 or greater  Inability to urinate 6 hours after discharge from hospital  Severe pain not relieved by pain medications  Persistent of heavy bleeding at incision site  Redness or swelling around incision site after a week  Increasing nausea or vomiting  Patient Signature________________________________________ Nurse Signature_________________________________________

## 2015-01-17 ENCOUNTER — Encounter (HOSPITAL_COMMUNITY): Payer: Self-pay | Admitting: Obstetrics and Gynecology

## 2015-07-10 DIAGNOSIS — E669 Obesity, unspecified: Secondary | ICD-10-CM | POA: Insufficient documentation

## 2016-03-08 DIAGNOSIS — R87810 Cervical high risk human papillomavirus (HPV) DNA test positive: Secondary | ICD-10-CM | POA: Insufficient documentation

## 2016-09-06 HISTORY — PX: BREAST BIOPSY: SHX20

## 2017-03-03 ENCOUNTER — Other Ambulatory Visit: Payer: Self-pay | Admitting: Obstetrics and Gynecology

## 2017-03-03 DIAGNOSIS — N63 Unspecified lump in unspecified breast: Secondary | ICD-10-CM

## 2017-03-07 ENCOUNTER — Other Ambulatory Visit: Payer: Self-pay | Admitting: Obstetrics and Gynecology

## 2017-03-07 ENCOUNTER — Ambulatory Visit
Admission: RE | Admit: 2017-03-07 | Discharge: 2017-03-07 | Disposition: A | Discharge status: Home / Self Care | Payer: BLUE CROSS/BLUE SHIELD | Source: Ambulatory Visit | Attending: Obstetrics and Gynecology | Admitting: Obstetrics and Gynecology

## 2017-03-07 DIAGNOSIS — N6320 Unspecified lump in the left breast, unspecified quadrant: Secondary | ICD-10-CM

## 2017-03-07 DIAGNOSIS — N63 Unspecified lump in unspecified breast: Secondary | ICD-10-CM

## 2017-03-08 ENCOUNTER — Ambulatory Visit
Admission: RE | Admit: 2017-03-08 | Discharge: 2017-03-08 | Disposition: A | Discharge status: Home / Self Care | Payer: BLUE CROSS/BLUE SHIELD | Source: Ambulatory Visit | Attending: Obstetrics and Gynecology | Admitting: Obstetrics and Gynecology

## 2017-03-08 ENCOUNTER — Other Ambulatory Visit: Payer: Self-pay | Admitting: Obstetrics and Gynecology

## 2017-03-08 DIAGNOSIS — N6320 Unspecified lump in the left breast, unspecified quadrant: Secondary | ICD-10-CM

## 2017-07-23 DIAGNOSIS — J9801 Acute bronchospasm: Secondary | ICD-10-CM | POA: Diagnosis not present

## 2017-07-23 DIAGNOSIS — R05 Cough: Secondary | ICD-10-CM | POA: Diagnosis not present

## 2017-07-23 DIAGNOSIS — J40 Bronchitis, not specified as acute or chronic: Secondary | ICD-10-CM | POA: Diagnosis not present

## 2017-07-23 DIAGNOSIS — Z6834 Body mass index (BMI) 34.0-34.9, adult: Secondary | ICD-10-CM | POA: Diagnosis not present

## 2017-08-23 DIAGNOSIS — E890 Postprocedural hypothyroidism: Secondary | ICD-10-CM | POA: Diagnosis not present

## 2017-08-23 DIAGNOSIS — E559 Vitamin D deficiency, unspecified: Secondary | ICD-10-CM | POA: Diagnosis not present

## 2018-02-06 ENCOUNTER — Encounter: Payer: Self-pay | Admitting: Family Medicine

## 2018-02-06 ENCOUNTER — Ambulatory Visit (INDEPENDENT_AMBULATORY_CARE_PROVIDER_SITE_OTHER): Payer: 59 | Admitting: Family Medicine

## 2018-02-06 VITALS — BP 105/71 | HR 98 | Temp 99.4°F | Ht 60.0 in | Wt 181.0 lb

## 2018-02-06 DIAGNOSIS — Z7689 Persons encountering health services in other specified circumstances: Secondary | ICD-10-CM | POA: Diagnosis not present

## 2018-02-06 DIAGNOSIS — R69 Illness, unspecified: Secondary | ICD-10-CM | POA: Diagnosis not present

## 2018-02-06 DIAGNOSIS — E890 Postprocedural hypothyroidism: Secondary | ICD-10-CM | POA: Diagnosis not present

## 2018-02-06 DIAGNOSIS — E669 Obesity, unspecified: Secondary | ICD-10-CM

## 2018-02-06 DIAGNOSIS — Z8742 Personal history of other diseases of the female genital tract: Secondary | ICD-10-CM

## 2018-02-06 DIAGNOSIS — Z87898 Personal history of other specified conditions: Secondary | ICD-10-CM

## 2018-02-06 DIAGNOSIS — E559 Vitamin D deficiency, unspecified: Secondary | ICD-10-CM | POA: Diagnosis not present

## 2018-02-06 DIAGNOSIS — R87810 Cervical high risk human papillomavirus (HPV) DNA test positive: Secondary | ICD-10-CM

## 2018-02-06 DIAGNOSIS — N63 Unspecified lump in unspecified breast: Secondary | ICD-10-CM | POA: Diagnosis not present

## 2018-02-06 DIAGNOSIS — E042 Nontoxic multinodular goiter: Secondary | ICD-10-CM | POA: Insufficient documentation

## 2018-02-06 NOTE — Progress Notes (Signed)
Subjective: GB:TDVVOHYWV care, breast mass HPI: Melanie Rose is a 37 y.o. female presenting to clinic today for:  1. Breast mass Patient reports a breast mass within the left outer quadrant of the breast that is been present for a while now.  She actually had her OB/GYN evaluate this and it was biopsied.  Biopsy results determined lesion to be benign.  Per her report, the OB/GYN did not recommend excision of the lesion, as there was concerned that postsurgical scarring would prevent visualization of breast cancers as patient aged.  The patient reports that the lesion seems to be growing and now is becoming more tender, she reports that she even has tenderness on the opposite side of the breast.  She does report that the tenderness seems to be more prominent during menses.  Denies any nipple discharge, skin changes, unplanned weight loss or night sweats.  She does report that the nipple seems to be more sensitive however.  2.  Hypothyroidism Patient reports that she had a thyroid ablation many years ago.  She has been stable on Synthroid 100 mcg daily for about 1 year now.  Previously seen by endocrinology but patient would like to be established within [facility] if possible.  She reports intermittent floaters in her vision that has been evaluated by optometry and determined to be benign.  Denies any constipation, diarrhea, tremors.  3.  History of abnormal Pap smear Patient reports that she has had an abnormal Pap smear for years.  She was previously seen by OB/GYN who did a colposcopy and she reports that findings were benign.  She has been monitored annually by OB/GYN and would like to establish with one in [place] if possible.  Reports regular menses that sometimes have clots in them but no excessive cramping.  She has a history of BTL.  4.  Vitamin D deficiency Patient reports that she has a several year history of vitamin D deficiency and insufficiency.  She is actually status post  treatment with vitamin D.  Last vitamin D level was 24.6 in December.  She has not had her follow-up vitamin D level checked but would like this today.  Past Medical History:  Diagnosis Date  . Abnormal Pap smear   . Chronic kidney disease    kidney stones  . Constipation   . Fibromyalgia   . Generalized headaches   . GERD (gastroesophageal reflux disease)   . Hemorrhoids   . Hypothyroidism    Past Surgical History:  Procedure Laterality Date  . LAPAROSCOPIC TUBAL LIGATION Bilateral 01/16/2015   Procedure: LAPAROSCOPIC TUBAL LIGATION;  Surgeon: Newton Pigg, MD;  Location: [facility];  Service: Gynecology;  Laterality: Bilateral;   Social History   Socioeconomic History  . Marital status: Married    Spouse name: Not on file  . Number of children: Not on file  . Years of education: Not on file  . Highest education level: Not on file  Occupational History  . Not on file  Social Needs  . Financial resource strain: Not on file  . Food insecurity:    Worry: Not on file    Inability: Not on file  . Transportation needs:    Medical: Not on file    Non-medical: Not on file  Tobacco Use  . Smoking status: Former Smoker    Packs/day: 1.00    Years: 10.00    Pack years: 10.00    Types: Cigarettes    Last attempt to quit: 06/22/2009  Years since quitting: 8.6  . Smokeless tobacco: Never Used  Substance and Sexual Activity  . Alcohol use: No  . Drug use: No  . Sexual activity: Yes    Birth control/protection: Surgical  Lifestyle  . Physical activity:    Days per week: Not on file    Minutes per session: Not on file  . Stress: Not on file  Relationships  . Social connections:    Talks on phone: Not on file    Gets together: Not on file    Attends religious service: Not on file    Active member of club or organization: Not on file    Attends meetings of clubs or organizations: Not on file    Relationship status: Not on file  . Intimate partner violence:    Fear of  current or ex partner: Not on file    Emotionally abused: Not on file    Physically abused: Not on file    Forced sexual activity: Not on file  Other Topics Concern  . Not on file  Social History Narrative   [name] is a pleasant female who is married and has 2 children, son age 71, daughter age 94.  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   Current Meds  Medication Sig  . levothyroxine (SYNTHROID, LEVOTHROID) 100 MCG tablet TAKE 1 TABLET BY MOUTH  EVERY DAY   Family History  Problem Relation Age of Onset  . Hypertension Mother   . Hypertension Father   . Heart disease Maternal Grandfather   . Cancer Paternal Grandfather    No Known Allergies   Health Maintenance: UTD  ROS: Per HPI  Objective: Office vital signs reviewed. BP 105/71   Pulse 98   Temp 99.4 F (37.4 C)   Ht 5' (1.524 m)   Wt 181 lb (82.1 kg)   BMI 35.35 kg/m   Physical Examination:  General: Awake, alert, well nourished, No acute distress HEENT: Normal    Neck: No masses palpated. No lymphadenopathy; no goiter    Ears: Tympanic membranes intact, normal light reflex, no erythema, no bulging    Eyes: PERRLA, extraocular movement in tact, sclera white; no exophthalmos    Nose: nasal turbinates moist, clear nasal discharge    Throat: moist mucus membranes, no erythema, no tonsillar exudate.  Airway is patent Cardio: regular rate and rhythm, S1S2 heard, no murmurs appreciated Pulm: clear to auscultation bilaterally, no wheezes, rhonchi or rales; normal work of breathing on room air Extremities: warm, well perfused, No edema, cyanosis or clubbing; +2 pulses bilaterally MSK: Normal gait and normal station Skin: dry; intact; no rashes or lesions Neuro: Follows all commands.  No resting tremor noted. Psych: Mood stable, affect somewhat blunted, pleasant, interactive. Depression screen PHQ 2/9 02/06/2018  Decreased Interest 0  Down, Depressed, Hopeless 0  PHQ - 2 Score 0    Assessment/  Plan: 37 y.o. female   1. Postablative hypothyroidism It sounds like patient has been doing well on Synthroid 100 mcg.  She has refills on this medication right now.  She does wish to establish with endocrinology.  Referral has been placed.  In the interim, will obtain thyroid levels and call patient once resulted. - TSH - T3, Free - T4, Free - Ambulatory referral to Endocrinology  2. Establishing care with new doctor, encounter for Release of information form needed for dermatology, Dr. Juel Burrow office for what sounds like a keloid that was treated with corticosteroids.  3.  Vitamin D deficiency We will check vitamin D level today.  Will most likely need to be on over-the-counter vitamin D going forward to maintain levels. - VITAMIN D 25 Hydroxy (Vit-D Deficiency, Fractures)  4. Obesity (BMI 30-39.9) Check CMP. - CMP14+EGFR  5. Cervical high risk HPV (human papillomavirus) test positive Will place referral to OB/GYN for continued surveillance of her abnormal Pap smears.  She requests [place]. - Ambulatory referral to Obstetrics / Gynecology  6. History of abnormal cervical Pap smear - Ambulatory referral to Obstetrics / Gynecology  7. Breast mass in female Patient does wish to have a conversation about the breast mass with a Psychologist, sport and exercise.  I recommended that she see [facility] in [place], as they have several breast specialist there.   - Ambulatory referral to General Surgery - Ambulatory referral to Obstetrics / Gynecology   [place], [place] [phone]

## 2018-02-06 NOTE — Patient Instructions (Signed)
You had labs performed today.  You will be contacted with the results of the labs once they are available, usually in the next 3 business days for routine lab work.  If you had a pap smear or biopsy performed, expect to be contacted in about 7-10 days.  I have placed referrals to OB/GYN, endocrinology and the surgeon.  If you've not heard from their offices within the week for an appointment, please contact our office.   Fibroadenoma Fibroadenoma is a type of breast tumor that is not cancerous (is benign). These tumors are made up of breast tissue and the tissue that holds breast tissue together (connective tissue). There are several types of fibroadenomas:  Simple fibroadenoma. This is the most common type. It consists of a single type of tissue throughout the tumor.  Complex fibroadenoma. This type of tumor contains more than one kind of tissue or irregular tissue.  Juvenile fibroadenoma. This is a type of tumor that can develop in adolescent girls. It tends to grow larger over time than other adenomas.  A fibroadenoma usually occurs as a single lump, but sometimes there may be more than one lump. Fibroadenomas vary in size. They can occur in one breast or in both breasts. Some fibroadenomas are too small to feel, but a larger one may feel like a firm, smooth lump that moves beneath your fingers. Although fibroadenomas are not cancer, having a fibroadenoma may slightly increase your risk for developing breast cancer in the future. What are the causes? The exact cause of fibroadenoma is not known. What increases the risk? This condition is more likely to develop in:  Women who are 11-45 years of age.  Women of African-American descent.  What are the signs or symptoms? A fibroadenoma may not cause any symptoms. These tumors usually do not cause pain unless they grow to a large size. A fibroadenoma may feel like a lump in your breast that is:  Firm.  Round.  Smooth.  Slightly  moveable.  How is this diagnosed? You may notice a breast lump during a breast self-exam. Your health care provider may discover it during a routine breast exam or mammogram. Your health care provider may suspect fibroadenoma if you have a breast lump that feels firm, round, and smooth and appears smooth on your mammogram. Other tests may be done to confirm the diagnosis, including:  An ultrasound to check for fluid inside the lump (cystic tumor).  A procedure that uses a needle to remove fluid from a cystic tumor. The fluid is then checked under a microscope for cancer cells.  A mammogram to examine a lump that is not cystic (is solid).  A procedure that uses a needle to remove a sample of tissue from the lump (breast biopsy) to examine under a microscope. This test is the only method that can be used to confirm that a tumor is a fibroadenoma and is not cancer.  How is this treated? Treatment for this condition may include:  Having breast exams regularly to check for changes in your fibroadenoma.  Having the fibroadenoma removed. A fibroadenoma may be removed if it is: ? Large. ? Continuing to grow. ? Causing symptoms. ? Changing the appearance of your breast. ? A juvenile fibroadenoma. These tend to grow large over time.  Follow these instructions at home:   If you had a fibroadenoma removed, follow instructions from your health care provider for care after the procedure.  Perform breast self-exams at home as told by your  health care provider.  Keep all follow-up visits as told by your health care provider. This is important. Contact a health care provider if:  Your fibroadenoma becomes larger, feels different, or becomes painful.  You find a new breast lump.  You have any changes in the skin that covers your breast. These include: ? Dimpling. ? Bruising. ? Thickening. ? Redness.  You have any changes in your nipple.  You have fluid leaking from your nipple. This  information is not intended to replace advice given to you by your health care provider. Make sure you discuss any questions you have with your health care provider. Document Released: 01/07/2015 Document Revised: 01/29/2016 Document Reviewed: 08/14/2014 Elsevier Interactive Patient Education  [employer].

## 2018-02-07 ENCOUNTER — Other Ambulatory Visit: Payer: Self-pay | Admitting: Family Medicine

## 2018-02-07 DIAGNOSIS — R7989 Other specified abnormal findings of blood chemistry: Secondary | ICD-10-CM

## 2018-02-07 LAB — TSH: TSH: 3.53 u[IU]/mL (ref 0.450–4.500)

## 2018-02-07 LAB — CMP14+EGFR
ALK PHOS: 70 IU/L (ref 39–117)
ALT: 20 IU/L (ref 0–32)
AST: 17 IU/L (ref 0–40)
Albumin/Globulin Ratio: 1.8 (ref 1.2–2.2)
Albumin: 4.5 g/dL (ref 3.5–5.5)
BUN/Creatinine Ratio: 6 — ABNORMAL LOW (ref 9–23)
BUN: 7 mg/dL (ref 6–20)
Bilirubin Total: 0.3 mg/dL (ref 0.0–1.2)
CO2: 23 mmol/L (ref 20–29)
CREATININE: 1.15 mg/dL — AB (ref 0.57–1.00)
Calcium: 9.5 mg/dL (ref 8.7–10.2)
Chloride: 102 mmol/L (ref 96–106)
GFR calc Af Amer: 72 mL/min/{1.73_m2} (ref >59)
GFR calc non Af Amer: 62 mL/min/{1.73_m2} (ref >59)
GLOBULIN, TOTAL: 2.5 g/dL (ref 1.5–4.5)
Glucose: 102 mg/dL — ABNORMAL HIGH (ref 65–99)
POTASSIUM: 4.7 mmol/L (ref 3.5–5.2)
SODIUM: 140 mmol/L (ref 134–144)
Total Protein: 7 g/dL (ref 6.0–8.5)

## 2018-02-07 LAB — VITAMIN D 25 HYDROXY (VIT D DEFICIENCY, FRACTURES): VIT D 25 HYDROXY: 24.1 ng/mL — AB (ref 30.0–100.0)

## 2018-02-07 LAB — T3, FREE: T3 FREE: 2.6 pg/mL (ref 2.0–4.4)

## 2018-02-07 LAB — T4, FREE: Free T4: 1.36 ng/dL (ref 0.82–1.77)

## 2018-02-08 ENCOUNTER — Other Ambulatory Visit: Payer: Self-pay | Admitting: Family Medicine

## 2018-02-08 DIAGNOSIS — N6320 Unspecified lump in the left breast, unspecified quadrant: Secondary | ICD-10-CM

## 2018-02-09 ENCOUNTER — Encounter: Payer: Self-pay | Admitting: Family Medicine

## 2018-02-13 ENCOUNTER — Other Ambulatory Visit: Payer: Self-pay | Admitting: Family Medicine

## 2018-02-13 DIAGNOSIS — N6320 Unspecified lump in the left breast, unspecified quadrant: Secondary | ICD-10-CM

## 2018-02-14 ENCOUNTER — Ambulatory Visit (HOSPITAL_COMMUNITY)
Admission: RE | Admit: 2018-02-14 | Discharge: 2018-02-14 | Disposition: A | Discharge status: Home / Self Care | Payer: 59 | Source: Ambulatory Visit | Attending: Family Medicine | Admitting: Family Medicine

## 2018-02-14 ENCOUNTER — Encounter (HOSPITAL_COMMUNITY): Payer: Self-pay

## 2018-02-14 ENCOUNTER — Other Ambulatory Visit: Payer: Self-pay | Admitting: Family Medicine

## 2018-02-14 DIAGNOSIS — N6320 Unspecified lump in the left breast, unspecified quadrant: Secondary | ICD-10-CM | POA: Insufficient documentation

## 2018-02-14 DIAGNOSIS — N6312 Unspecified lump in the right breast, upper inner quadrant: Secondary | ICD-10-CM | POA: Diagnosis not present

## 2018-02-14 DIAGNOSIS — R922 Inconclusive mammogram: Secondary | ICD-10-CM | POA: Diagnosis not present

## 2018-02-23 ENCOUNTER — Ambulatory Visit: Payer: 59 | Admitting: Family

## 2018-02-23 ENCOUNTER — Encounter: Payer: Self-pay | Admitting: Family

## 2018-02-23 VITALS — BP 113/74 | HR 91 | Temp 98.3°F | Ht 60.0 in | Wt 183.6 lb

## 2018-02-23 DIAGNOSIS — M5442 Lumbago with sciatica, left side: Secondary | ICD-10-CM

## 2018-02-23 MED ORDER — BACLOFEN 10 MG PO TABS: 10.0000 mg | ORAL_TABLET | Freq: Three times a day (TID) | ORAL | 0 refills | Status: DC

## 2018-02-23 MED ORDER — DICLOFENAC SODIUM 75 MG PO TBEC: 75.0000 mg | DELAYED_RELEASE_TABLET | Freq: Two times a day (BID) | ORAL | 0 refills | Status: DC

## 2018-02-23 MED ORDER — PREDNISONE 10 MG (21) PO TBPK
ORAL_TABLET | ORAL | 0 refills | Status: DC
Start: 2018-02-23 — End: 2018-03-20

## 2018-02-23 NOTE — Patient Instructions (Signed)
Sciatica Sciatica is pain, numbness, weakness, or tingling along the path of the sciatic nerve. The sciatic nerve starts in the lower back and runs down the back of each leg. The nerve controls the muscles in the lower leg and in the back of the knee. It also provides feeling (sensation) to the back of the thigh, the lower leg, and the sole of the foot. Sciatica is a symptom of another medical condition that pinches or puts pressure on the sciatic nerve. Generally, sciatica only affects one side of the body. Sciatica usually goes away on its own or with treatment. In some cases, sciatica may keep coming back (recur). What are the causes? This condition is caused by pressure on the sciatic nerve, or pinching of the sciatic nerve. This may be the result of:  A disk in between the bones of the spine (vertebrae) bulging out too far (herniated disk).  Age-related changes in the spinal disks (degenerative disk disease).  A pain disorder that affects a muscle in the buttock (piriformis syndrome).  Extra bone growth (bone spur) near the sciatic nerve.  An injury or break (fracture) of the pelvis.  Pregnancy.  Tumor (rare).  What increases the risk? The following factors may make you more likely to develop this condition:  Playing sports that place pressure or stress on the spine, such as football or weight lifting.  Having poor strength and flexibility.  A history of back injury.  A history of back surgery.  Sitting for long periods of time.  Doing activities that involve repetitive bending or lifting.  Obesity.  What are the signs or symptoms? Symptoms can vary from mild to very severe, and they may include:  Any of these problems in the lower back, leg, hip, or buttock: ? Mild tingling or dull aches. ? Burning sensations. ? Sharp pains.  Numbness in the back of the calf or the sole of the foot.  Leg weakness.  Severe back pain that makes movement difficult.  These  symptoms may get worse when you cough, sneeze, or laugh, or when you sit or stand for long periods of time. Being overweight may also make symptoms worse. In some cases, symptoms may recur over time. How is this diagnosed? This condition may be diagnosed based on:  Your symptoms.  A physical exam. Your health care provider may ask you to do certain movements to check whether those movements trigger your symptoms.  You may have tests, including: ? Blood tests. ? X-rays. ? MRI. ? CT scan.  How is this treated? In many cases, this condition improves on its own, without any treatment. However, treatment may include:  Reducing or modifying physical activity during periods of pain.  Exercising and stretching to strengthen your abdomen and improve the flexibility of your spine.  Icing and applying heat to the affected area.  Medicines that help: ? To relieve pain and swelling. ? To relax your muscles.  Injections of medicines that help to relieve pain, irritation, and inflammation around the sciatic nerve (steroids).  Surgery.  Follow these instructions at home: Medicines  Take over-the-counter and prescription medicines only as told by your health care provider.  Do not drive or operate heavy machinery while taking prescription pain medicine. Managing pain  If directed, apply ice to the affected area. ? Put ice in a plastic bag. ? Place a towel between your skin and the bag. ? Leave the ice on for 20 minutes, 2-3 times a day.  After icing, apply   heat to the affected area before you exercise or as often as told by your health care provider. Use the heat source that your health care provider recommends, such as a moist heat pack or a heating pad. ? Place a towel between your skin and the heat source. ? Leave the heat on for 20-30 minutes. ? Remove the heat if your skin turns bright red. This is especially important if you are unable to feel pain, heat, or cold. You may have a  greater risk of getting burned. Activity  Return to your normal activities as told by your health care provider. Ask your health care provider what activities are safe for you. ? Avoid activities that make your symptoms worse.  Take brief periods of rest throughout the day. Resting in a lying or standing position is usually better than sitting to rest. ? When you rest for longer periods, mix in some mild activity or stretching between periods of rest. This will help to prevent stiffness and pain. ? Avoid sitting for long periods of time without moving. Get up and move around at least one time each hour.  Exercise and stretch regularly, as told by your health care provider.  Do not lift anything that is heavier than 10 lb (4.5 kg) while you have symptoms of sciatica. When you do not have symptoms, you should still avoid heavy lifting, especially repetitive heavy lifting.  When you lift objects, always use proper lifting technique, which includes: ? Bending your knees. ? Keeping the load close to your body. ? Avoiding twisting. General instructions  Use good posture. ? Avoid leaning forward while sitting. ? Avoid hunching over while standing.  Maintain a healthy weight. Excess weight puts extra stress on your back and makes it difficult to maintain good posture.  Wear supportive, comfortable shoes. Avoid wearing high heels.  Avoid sleeping on a mattress that is too soft or too hard. A mattress that is firm enough to support your back when you sleep may help to reduce your pain.  Keep all follow-up visits as told by your health care provider. This is important. Contact a health care provider if:  You have pain that wakes you up when you are sleeping.  You have pain that gets worse when you lie down.  Your pain is worse than you have experienced in the past.  Your pain lasts longer than 4 weeks.  You experience unexplained weight loss. Get help right away if:  You lose control  of your bowel or bladder (incontinence).  You have: ? Weakness in your lower back, pelvis, buttocks, or legs that gets worse. ? Redness or swelling of your back. ? A burning sensation when you urinate. This information is not intended to replace advice given to you by your health care provider. Make sure you discuss any questions you have with your health care provider. Document Released: 08/17/2001 Document Revised: 01/27/2016 Document Reviewed: 05/02/2015 Elsevier Interactive Patient Education  2018 Elsevier Inc.  

## 2018-02-23 NOTE — Progress Notes (Signed)
   Subjective:    Patient ID: Melanie Rose, female    DOB: [birth year], 37 y.o.   MRN: [id number]  Chief Complaint  Patient presents with  . middle/lower back pain radiating down leg    Back Pain  This is a new problem. The current episode started in the past 7 days. The problem occurs constantly. The problem has been waxing and waning since onset. The pain is present in the gluteal. The quality of the pain is described as aching and shooting. The pain radiates to the left thigh. The pain is at a severity of 6/10. The pain is moderate. The symptoms are aggravated by bending and standing. Associated symptoms include leg pain, numbness and tingling. Pertinent negatives include no bladder incontinence, bowel incontinence or dysuria. She has tried home exercises and analgesics for the symptoms. The treatment provided mild relief.      Review of Systems  Gastrointestinal: Negative for bowel incontinence.  Genitourinary: Negative for bladder incontinence and dysuria.  Musculoskeletal: Positive for back pain.  Neurological: Positive for tingling and numbness.  All other systems reviewed and are negative.      Objective:   Physical Exam  Constitutional: She is oriented to person, place, and time. She appears well-developed and well-nourished. No distress.  HENT:  Head: Normocephalic.  Eyes: Pupils are equal, round, and reactive to light.  Neck: Normal range of motion. Neck supple. No thyromegaly present.  Cardiovascular: Normal rate, regular rhythm, normal heart sounds and intact distal pulses.  No murmur heard. Pulmonary/Chest: Effort normal and breath sounds normal. No respiratory distress. She has no wheezes.  Abdominal: Soft. Bowel sounds are normal. She exhibits no distension. There is no tenderness.  Musculoskeletal: She exhibits no edema or tenderness.  Pain in lower lumbar with extension, Negative SLR   Neurological: She is alert and oriented to person, place, and time. She has  normal reflexes. No cranial nerve deficit.  Skin: Skin is warm and dry.  Psychiatric: She has a normal mood and affect. Her behavior is normal. Judgment and thought content normal.  Vitals reviewed.     BP 113/74   Pulse 91   Temp 98.3 F (36.8 C) (Oral)   Ht 5' (1.524 m)   Wt 183 lb 9.6 oz (83.3 kg)   BMI 35.86 kg/m      Assessment & Plan:  Melanie Rose was seen today for middle/lower back pain radiating down leg.  Diagnoses and all orders for this visit:  Acute left-sided low back pain with left-sided sciatica -     predniSONE (STERAPRED UNI-PAK 21 TAB) 10 MG (21) TBPK tablet; Use as directed -     diclofenac (VOLTAREN) 75 MG EC tablet; Take 1 tablet (75 mg total) by mouth 2 (two) times daily. -     baclofen (LIORESAL) 10 MG tablet; Take 1 tablet (10 mg total) by mouth 3 (three) times daily.   Rest Ice  ROM exercises encouraged No other NSAID's while taking Diclofenac  Sedation precautions discussed with Baclofen RTO if symptoms worsen or do not improve  Melanie Dun, FNP

## 2018-02-24 ENCOUNTER — Encounter: Payer: Self-pay | Admitting: Family

## 2018-02-27 ENCOUNTER — Ambulatory Visit: Payer: Self-pay | Admitting: Surgery

## 2018-02-27 DIAGNOSIS — N6320 Unspecified lump in the left breast, unspecified quadrant: Secondary | ICD-10-CM | POA: Diagnosis not present

## 2018-02-27 DIAGNOSIS — N62 Hypertrophy of breast: Secondary | ICD-10-CM | POA: Diagnosis not present

## 2018-02-27 NOTE — H&P (Signed)
[name] Documented: 02/27/2018 2:00 PM Location: [facility] Patient #: [id number] DOB: 1981/04/21 Married / Language: English / Race: White Female  History of Present Illness Marcello Moores A. Gladiola Madore MD; 02/27/2018 2:29 PM) Patient words: Pt sent at the request of Dr Lajuana Ripple with a history of left breast mass. This was detected in July 2018 after being filled by the patient. She thinks is got larger. 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 Repeat ultrasound showed this increase in size by 5 mm of the last 12 months. The patient can feel it easier now. There's been no discharge from the nipple or nipple any other mass detected. She also complains of upper back pain is a history of macromastia. No family history of breast cancer. No redness or drainage from either breast.                            History of benign LEFT breast biopsy in 2018 with pathology result of hamartomatous lesion. Patient feels the mass has enlarged.  EXAM: DIGITAL DIAGNOSTIC LEFT MAMMOGRAM WITH CAD AND TOMO  ULTRASOUND LEFT BREAST  COMPARISON: Previous exam(s).  ACR Breast Density Category c: The breast tissue is heterogeneously dense, which may obscure small masses.  FINDINGS: The mass within upper-outer quadrant of the LEFT breast, at posterior depth, with associated ribbon shaped biopsy clip, has enlarged compared to the previous postprocedure mammogram of 03/08/2017.  There are no new dominant masses, suspicious calcifications or secondary signs of malignancy within the LEFT breast on today's exam.  Mammographic images were processed with CAD.  Targeted ultrasound is performed, showing an irregular hypoechoic mass in the LEFT breast at the 1:30 o'clock axis, 14 cm from the nipple, measuring 1.9 x 1.1 x 1.9 cm, enlarged compared to the previous ultrasound of 03/07/2017 where it measured 1.6 x 0.8 x 1.4 cm.  IMPRESSION: The previously biopsied mass  within the LEFT breast at the 1:30 o'clock axis, 14 cm from the nipple, has enlarged since the previous ultrasound of 03/07/2017, measurements provided above. Although previous biopsy results were benign (hamartomatous lesion), the significant interval enlargement necessitates surgical excision to ensure benignity.  RECOMMENDATION: Surgical excision of the previously biopsied mass within the LEFT breast, 1:30 o'clock axis, due to the significant interval enlargement since previous ultrasound.  Findings discussed with the ordering physician. Per this discussion, surgical consultation has already been arranged.  I have discussed the findings and recommendations with the patient. Results were also provided in writing at the conclusion of the visit. If applicable, a reminder letter will be sent to the patient regarding the next appointment.  BI-RADS CATEGORY 4: Suspicious.   Electronically Signed By: Franki Cabot M.D. On: 02/14/2018 15:46.  The patient is a 37 year old female.   Past Surgical History Sabino Gasser; 02/27/2018 2:00 PM) Breast Biopsy Left. Oral Surgery  Diagnostic Studies History Sabino Gasser; 02/27/2018 2:00 PM) Colonoscopy never Mammogram within last year Pap Smear 1-5 years ago  Allergies Sabino Gasser; 02/27/2018 2:01 PM) No Known Drug Allergies [02/27/2018]:  Medication History Sabino Gasser; 02/27/2018 2:01 PM) Levothyroxine Sodium (25MCG Tablet, Oral) Active. Medications Reconciled  Social History Sabino Gasser; 02/27/2018 2:00 PM) Alcohol use Remotely quit alcohol use. Caffeine use Carbonated beverages, Coffee, Tea. Illicit drug use Remotely quit drug use. Tobacco use Former smoker.  Family History Sabino Gasser; 02/27/2018 2:00 PM) Alcohol Abuse Brother, Father. Hypertension Father, Mother. Migraine Headache Father.  Pregnancy / Birth History Sabino Gasser; 02/27/2018 2:00  PM) Age at menarche 6 years. Contraceptive  History Contraceptive implant, Depo-provera, Oral contraceptives. Gravida 2 Maternal age 19-20 Para 2 Regular periods  Other Problems Sabino Gasser; 02/27/2018 2:00 PM) Back Pain Kidney Stone Lump In Breast Thyroid Disease     Review of Systems (Jodean Valade A. Andriea Hasegawa MD; 02/27/2018 2:29 PM) General Not Present- Appetite Loss, Chills, Fatigue, Fever, Night Sweats, Weight Gain and Weight Loss. Skin Not Present- Change in Wart/Mole, Dryness, Hives, Jaundice, New Lesions, Non-Healing Wounds, Rash and Ulcer. HEENT Not Present- Earache, Hearing Loss, Hoarseness, Nose Bleed, Oral Ulcers, Ringing in the Ears, Seasonal Allergies, Sinus Pain, Sore Throat, Visual Disturbances, Wears glasses/contact lenses and Yellow Eyes. Respiratory Not Present- Bloody sputum, Chronic Cough, Difficulty Breathing, Snoring and Wheezing. Breast Present- Breast Mass, Breast Pain and Skin Changes. Not Present- Nipple Discharge. Cardiovascular Not Present- Chest Pain, Difficulty Breathing Lying Down, Leg Cramps, Palpitations, Rapid Heart Rate, Shortness of Breath and Swelling of Extremities. Gastrointestinal Not Present- Abdominal Pain, Bloating, Bloody Stool, Change in Bowel Habits, Chronic diarrhea, Constipation, Difficulty Swallowing, Excessive gas, Gets full quickly at meals, Hemorrhoids, Indigestion, Nausea, Rectal Pain and Vomiting. Female Genitourinary Not Present- Frequency, Nocturia, Painful Urination, Pelvic Pain and Urgency. Musculoskeletal Not Present- Back Pain, Joint Pain, Joint Stiffness, Muscle Pain, Muscle Weakness and Swelling of Extremities. Neurological Not Present- Decreased Memory, Fainting, Headaches, Numbness, Seizures, Tingling, Tremor, Trouble walking and Weakness. Psychiatric Not Present- Anxiety, Bipolar, Change in Sleep Pattern, Depression, Fearful and Frequent crying. Endocrine Not Present- Cold Intolerance, Excessive Hunger, Hair Changes, Heat Intolerance, Hot flashes and New  Diabetes. Hematology Not Present- Blood Thinners, Easy Bruising, Excessive bleeding, Gland problems, HIV and Persistent Infections. All other systems negative  Vitals Sabino Gasser; 02/27/2018 2:02 PM) 02/27/2018 2:01 PM Weight: 184.5 lb Height: 60in Body Surface Area: 1.8 m Body Mass Index: 36.03 kg/m  Temp.: 98.45F(Oral)  Pulse: 119 (Regular)  BP: 124/72 (Sitting, Left Arm, Standard)      Physical Exam (Esmae Donathan A. Marqueta Pulley MD; 02/27/2018 2:30 PM)  General Mental Status-Alert. General Appearance-Consistent with stated age. Hydration-Well hydrated. Voice-Normal.  Head and Neck Head-normocephalic, atraumatic with no lesions or palpable masses. Trachea-midline. Thyroid Gland Characteristics - normal size and consistency.  Eye Eyeball - Bilateral-Extraocular movements intact. Sclera/Conjunctiva - Bilateral-No scleral icterus.  Chest and Lung Exam Chest and lung exam reveals -quiet, even and easy respiratory effort with no use of accessory muscles and on auscultation, normal breath sounds, no adventitious sounds and normal vocal resonance. Inspection Chest Wall - Normal. Back - normal.  Breast Note: left breast UOQ 2 cm mobile mass no other masses bilaterally macromastia  Cardiovascular Cardiovascular examination reveals -normal heart sounds, regular rate and rhythm with no murmurs and normal pedal pulses bilaterally.  Neurologic Neurologic evaluation reveals -alert and oriented x 3 with no impairment of recent or remote memory. Mental Status-Normal.  Musculoskeletal Normal Exam - Left-Upper Extremity Strength Normal and Lower Extremity Strength Normal. Normal Exam - Right-Upper Extremity Strength Normal and Lower Extremity Strength Normal.  Lymphatic Head & Neck  General Head & Neck Lymphatics: Bilateral - Description - Normal. Axillary  General Axillary Region: Bilateral - Description - Normal. Tenderness - Non  Tender.    Assessment & Plan (Antania Hoefling A. Leeandre Nordling MD; 02/27/2018 2:30 PM)  LEFT BREAST MASS (N63.20) Impression: Given the increase in size recommend left breast lumpectomy. Risk of lumpectomy include bleeding, infection, seroma, more surgery, use of seed/wire, wound care, cosmetic deformity and the need for other treatments, death , blood clots, death. Pt agrees to proceed.  MACROMASTIA (N62) Impression: Refer to plastics for consideration of bilateral breast reduction which the patient is interested in.  Current Plans Pt Education - [facility] Free Text Education/Instructions: discussed with patient and provided information. Pt Education - [facility] Breast Biopsy HCI: discussed with patient and provided information. Pt Education - [facility] General Post-op HCI

## 2018-02-27 NOTE — H&P (View-Only) (Signed)
[name] Documented: 02/27/2018 2:00 PM Location: [facility] Patient #: [id number] DOB: 1980/09/23 Married / Language: English / Race: White Female  History of Present Illness Marcello Moores A. Shonna Deiter MD; 02/27/2018 2:29 PM) Patient words: Pt sent at the request of Dr Lajuana Ripple with a history of left breast mass. This was detected in July 2018 after being filled by the patient. She thinks is got larger. 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 Repeat ultrasound showed this increase in size by 5 mm of the last 12 months. The patient can feel it easier now. There's been no discharge from the nipple or nipple any other mass detected. She also complains of upper back pain is a history of macromastia. No family history of breast cancer. No redness or drainage from either breast.                            History of benign LEFT breast biopsy in 2018 with pathology result of hamartomatous lesion. Patient feels the mass has enlarged.  EXAM: DIGITAL DIAGNOSTIC LEFT MAMMOGRAM WITH CAD AND TOMO  ULTRASOUND LEFT BREAST  COMPARISON: Previous exam(s).  ACR Breast Density Category c: The breast tissue is heterogeneously dense, which may obscure small masses.  FINDINGS: The mass within upper-outer quadrant of the LEFT breast, at posterior depth, with associated ribbon shaped biopsy clip, has enlarged compared to the previous postprocedure mammogram of 03/08/2017.  There are no new dominant masses, suspicious calcifications or secondary signs of malignancy within the LEFT breast on today's exam.  Mammographic images were processed with CAD.  Targeted ultrasound is performed, showing an irregular hypoechoic mass in the LEFT breast at the 1:30 o'clock axis, 14 cm from the nipple, measuring 1.9 x 1.1 x 1.9 cm, enlarged compared to the previous ultrasound of 03/07/2017 where it measured 1.6 x 0.8 x 1.4 cm.  IMPRESSION: The previously biopsied mass  within the LEFT breast at the 1:30 o'clock axis, 14 cm from the nipple, has enlarged since the previous ultrasound of 03/07/2017, measurements provided above. Although previous biopsy results were benign (hamartomatous lesion), the significant interval enlargement necessitates surgical excision to ensure benignity.  RECOMMENDATION: Surgical excision of the previously biopsied mass within the LEFT breast, 1:30 o'clock axis, due to the significant interval enlargement since previous ultrasound.  Findings discussed with the ordering physician. Per this discussion, surgical consultation has already been arranged.  I have discussed the findings and recommendations with the patient. Results were also provided in writing at the conclusion of the visit. If applicable, a reminder letter will be sent to the patient regarding the next appointment.  BI-RADS CATEGORY 4: Suspicious.   Electronically Signed By: Franki Cabot M.D. On: 02/14/2018 15:46.  The patient is a 37 year old female.   Past Surgical History Sabino Gasser; 02/27/2018 2:00 PM) Breast Biopsy Left. Oral Surgery  Diagnostic Studies History Sabino Gasser; 02/27/2018 2:00 PM) Colonoscopy never Mammogram within last year Pap Smear 1-5 years ago  Allergies Sabino Gasser; 02/27/2018 2:01 PM) No Known Drug Allergies [02/27/2018]:  Medication History Sabino Gasser; 02/27/2018 2:01 PM) Levothyroxine Sodium (25MCG Tablet, Oral) Active. Medications Reconciled  Social History Sabino Gasser; 02/27/2018 2:00 PM) Alcohol use Remotely quit alcohol use. Caffeine use Carbonated beverages, Coffee, Tea. Illicit drug use Remotely quit drug use. Tobacco use Former smoker.  Family History Sabino Gasser; 02/27/2018 2:00 PM) Alcohol Abuse Brother, Father. Hypertension Father, Mother. Migraine Headache Father.  Pregnancy / Birth History Sabino Gasser; 02/27/2018 2:00  PM) Age at menarche 64 years. Contraceptive  History Contraceptive implant, Depo-provera, Oral contraceptives. Gravida 2 Maternal age 60-20 Para 2 Regular periods  Other Problems Sabino Gasser; 02/27/2018 2:00 PM) Back Pain Kidney Stone Lump In Breast Thyroid Disease     Review of Systems (Evangelia Whitaker A. Dangelo Guzzetta MD; 02/27/2018 2:29 PM) General Not Present- Appetite Loss, Chills, Fatigue, Fever, Night Sweats, Weight Gain and Weight Loss. Skin Not Present- Change in Wart/Mole, Dryness, Hives, Jaundice, New Lesions, Non-Healing Wounds, Rash and Ulcer. HEENT Not Present- Earache, Hearing Loss, Hoarseness, Nose Bleed, Oral Ulcers, Ringing in the Ears, Seasonal Allergies, Sinus Pain, Sore Throat, Visual Disturbances, Wears glasses/contact lenses and Yellow Eyes. Respiratory Not Present- Bloody sputum, Chronic Cough, Difficulty Breathing, Snoring and Wheezing. Breast Present- Breast Mass, Breast Pain and Skin Changes. Not Present- Nipple Discharge. Cardiovascular Not Present- Chest Pain, Difficulty Breathing Lying Down, Leg Cramps, Palpitations, Rapid Heart Rate, Shortness of Breath and Swelling of Extremities. Gastrointestinal Not Present- Abdominal Pain, Bloating, Bloody Stool, Change in Bowel Habits, Chronic diarrhea, Constipation, Difficulty Swallowing, Excessive gas, Gets full quickly at meals, Hemorrhoids, Indigestion, Nausea, Rectal Pain and Vomiting. Female Genitourinary Not Present- Frequency, Nocturia, Painful Urination, Pelvic Pain and Urgency. Musculoskeletal Not Present- Back Pain, Joint Pain, Joint Stiffness, Muscle Pain, Muscle Weakness and Swelling of Extremities. Neurological Not Present- Decreased Memory, Fainting, Headaches, Numbness, Seizures, Tingling, Tremor, Trouble walking and Weakness. Psychiatric Not Present- Anxiety, Bipolar, Change in Sleep Pattern, Depression, Fearful and Frequent crying. Endocrine Not Present- Cold Intolerance, Excessive Hunger, Hair Changes, Heat Intolerance, Hot flashes and New  Diabetes. Hematology Not Present- Blood Thinners, Easy Bruising, Excessive bleeding, Gland problems, HIV and Persistent Infections. All other systems negative  Vitals Sabino Gasser; 02/27/2018 2:02 PM) 02/27/2018 2:01 PM Weight: 184.5 lb Height: 60in Body Surface Area: 1.8 m Body Mass Index: 36.03 kg/m  Temp.: 98.75F(Oral)  Pulse: 119 (Regular)  BP: 124/72 (Sitting, Left Arm, Standard)      Physical Exam (Fizza Scales A. Costas Sena MD; 02/27/2018 2:30 PM)  General Mental Status-Alert. General Appearance-Consistent with stated age. Hydration-Well hydrated. Voice-Normal.  Head and Neck Head-normocephalic, atraumatic with no lesions or palpable masses. Trachea-midline. Thyroid Gland Characteristics - normal size and consistency.  Eye Eyeball - Bilateral-Extraocular movements intact. Sclera/Conjunctiva - Bilateral-No scleral icterus.  Chest and Lung Exam Chest and lung exam reveals -quiet, even and easy respiratory effort with no use of accessory muscles and on auscultation, normal breath sounds, no adventitious sounds and normal vocal resonance. Inspection Chest Wall - Normal. Back - normal.  Breast Note: left breast UOQ 2 cm mobile mass no other masses bilaterally macromastia  Cardiovascular Cardiovascular examination reveals -normal heart sounds, regular rate and rhythm with no murmurs and normal pedal pulses bilaterally.  Neurologic Neurologic evaluation reveals -alert and oriented x 3 with no impairment of recent or remote memory. Mental Status-Normal.  Musculoskeletal Normal Exam - Left-Upper Extremity Strength Normal and Lower Extremity Strength Normal. Normal Exam - Right-Upper Extremity Strength Normal and Lower Extremity Strength Normal.  Lymphatic Head & Neck  General Head & Neck Lymphatics: Bilateral - Description - Normal. Axillary  General Axillary Region: Bilateral - Description - Normal. Tenderness - Non  Tender.    Assessment & Plan (Damari Suastegui A. Kandi Brusseau MD; 02/27/2018 2:30 PM)  LEFT BREAST MASS (N63.20) Impression: Given the increase in size recommend left breast lumpectomy. Risk of lumpectomy include bleeding, infection, seroma, more surgery, use of seed/wire, wound care, cosmetic deformity and the need for other treatments, death , blood clots, death. Pt agrees to proceed.  MACROMASTIA (N62) Impression: Refer to plastics for consideration of bilateral breast reduction which the patient is interested in.  Current Plans Pt Education - [facility] Free Text Education/Instructions: discussed with patient and provided information. Pt Education - [facility] Breast Biopsy HCI: discussed with patient and provided information. Pt Education - [facility] General Post-op HCI

## 2018-02-28 ENCOUNTER — Other Ambulatory Visit: Payer: Self-pay | Admitting: Surgery

## 2018-02-28 DIAGNOSIS — N6320 Unspecified lump in the left breast, unspecified quadrant: Secondary | ICD-10-CM

## 2018-03-02 NOTE — Pre-Procedure Instructions (Signed)
Melanie Rose  03/02/2018      [id number] [address], [place] [address] Phone: [phone] Fax: [phone]  [place], [address] [place] [address] Phone: [phone] Fax: [phone]  [id number] [place], [address] [place] [address] Phone: [phone] Fax: [phone]    Your procedure is scheduled on Tues., March 07, 2018 from 2:30PM-3:30PM  Report to [facility] Admitting Entrance "A" at 12:30PM  Call this number if you have problems the morning of surgery:  [phone]   Remember:  Do not eat or drink after midnight on July 1st  Please complete your PRE-SURGERY ENSURE that was given to by 11:30AM the morning of surgery.  Please, if able, drink it in one setting. DO NOT SIP.    Take these medicines the morning of surgery with A SIP OF WATER:  Baclofen (LIORESAL) and Levothyroxine (SYNTHROID, LEVOTHROID)  As of today, stop taking all Other Aspirin Products, Vitamins, Fish oils, and Herbal medications. Also stop all NSAIDS i.e. Advil, Ibuprofen, Motrin, Aleve, Anaprox, Naproxen, BC, Goody Powders, and all Supplements. Including: Diclofenac (VOLTAREN)    Do not wear jewelry, make-up or nail polish.  Do not wear lotions, powders, or perfumes, or deodorant.  Do not shave 48 hours prior to surgery.    Do not bring valuables to the hospital.  [facility] is not responsible for any belongings or valuables.  Contacts, dentures or bridgework may not be worn into surgery.  Leave your suitcase in the car.  After surgery it may be brought to your room.  For patients admitted to the hospital, discharge time will be determined by your treatment team.  Patients discharged the day of surgery will not be allowed to drive home.   Special instructions:  [facility]- Preparing For  Surgery  Before surgery, you can play an important role. Because skin is not sterile, your skin needs to be as free of germs as possible. You can reduce the number of germs on your skin by washing with CHG (chlorahexidine gluconate) Soap before surgery.  CHG is an antiseptic cleaner which kills germs and bonds with the skin to continue killing germs even after washing.    Oral Hygiene is also important to reduce your risk of infection.  Remember - BRUSH YOUR TEETH THE MORNING OF SURGERY WITH YOUR REGULAR TOOTHPASTE  Please do not use if you have an allergy to CHG or antibacterial soaps. If your skin becomes reddened/irritated stop using the CHG.  Do not shave (including legs and underarms) for at least 48 hours prior to first CHG shower. It is OK to shave your face.  Please follow these instructions carefully.   1. Shower the NIGHT BEFORE SURGERY and the MORNING OF SURGERY with CHG.   2. If you chose to wash your hair, wash your hair first as usual with your normal shampoo.  3. After you shampoo, rinse your hair and body thoroughly to remove the shampoo.  4. Use CHG as you would any other liquid soap. You can apply CHG directly to the skin and wash gently with a scrungie or a clean washcloth.   5. Apply the CHG Soap to your body ONLY FROM THE NECK DOWN.  Do not use on open wounds or open sores. Avoid contact with your eyes, ears, mouth and genitals (private parts).  Wash Face and genitals (private parts)  with your normal soap.  6. Wash thoroughly, paying special attention to the area where your surgery will be performed.  7. Thoroughly rinse your body with warm water from the neck down.  8. DO NOT shower/wash with your normal soap after using and rinsing off the CHG Soap.  9. Pat yourself dry with a CLEAN TOWEL.  10. Wear CLEAN PAJAMAS to bed the night before surgery, wear comfortable clothes the morning of surgery  11. Place CLEAN SHEETS on your bed the night of your first shower and  DO NOT SLEEP WITH PETS.  Day of Surgery:  Do not apply any deodorants/lotions.  Please wear clean clothes to the hospital/surgery center.   Remember to brush your teeth WITH YOUR REGULAR TOOTHPASTE.  Please read over the following fact sheets that you were given. Pain Booklet, Coughing and Deep Breathing and Surgical Site Infection Prevention

## 2018-03-03 ENCOUNTER — Encounter (HOSPITAL_COMMUNITY)
Admission: RE | Admit: 2018-03-03 | Discharge: 2018-03-03 | Disposition: A | Discharge status: Home / Self Care | Payer: 59 | Source: Ambulatory Visit | Attending: Surgery | Admitting: Surgery

## 2018-03-03 ENCOUNTER — Other Ambulatory Visit: Payer: Self-pay

## 2018-03-03 ENCOUNTER — Encounter (HOSPITAL_COMMUNITY): Payer: Self-pay

## 2018-03-03 DIAGNOSIS — Z01812 Encounter for preprocedural laboratory examination: Secondary | ICD-10-CM | POA: Diagnosis not present

## 2018-03-03 HISTORY — DX: Personal history of urinary calculi: Z87.442

## 2018-03-03 HISTORY — DX: Unspecified lump in unspecified breast: N63.0

## 2018-03-03 LAB — BASIC METABOLIC PANEL
Anion gap: 8 (ref 5–15)
BUN: 6 mg/dL (ref 6–20)
CALCIUM: 9.3 mg/dL (ref 8.9–10.3)
CO2: 25 mmol/L (ref 22–32)
Chloride: 105 mmol/L (ref 98–111)
Creatinine, Ser: 0.97 mg/dL (ref 0.44–1.00)
GFR calc Af Amer: >60 mL/min (ref >60)
Glucose, Bld: 84 mg/dL (ref 70–99)
POTASSIUM: 4 mmol/L (ref 3.5–5.1)
SODIUM: 138 mmol/L (ref 135–145)

## 2018-03-03 LAB — CBC
HEMATOCRIT: 43.9 % (ref 36.0–46.0)
Hemoglobin: 14.4 g/dL (ref 12.0–15.0)
MCH: 30.4 pg (ref 26.0–34.0)
MCHC: 32.8 g/dL (ref 30.0–36.0)
MCV: 92.8 fL (ref 78.0–100.0)
PLATELETS: 320 10*3/uL (ref 150–400)
RBC: 4.73 MIL/uL (ref 3.87–5.11)
RDW: 12.4 % (ref 11.5–15.5)
WBC: 11.1 10*3/uL — AB (ref 4.0–10.5)

## 2018-03-03 NOTE — Progress Notes (Signed)
PCP - Dr. Tally Joe- [facility]  Cardiologist - Denies  Chest x-ray - Denies  EKG - Denies  Stress Test - Denies  ECHO - Denies  Cardiac Cath - Denies  Sleep Study - Denies CPAP - None  LABS- 03/03/18: CBC, BMP POC UPreg: 03/07/18  ASA- Denies   Anesthesia- No  Pt denies having chest pain, sob, or fever at this time. All instructions explained to the pt, with a verbal understanding of the material. Pt agrees to go over the instructions while at home for a better understanding. The opportunity to ask questions was provided.

## 2018-03-06 ENCOUNTER — Ambulatory Visit
Admission: RE | Admit: 2018-03-06 | Discharge: 2018-03-06 | Disposition: A | Discharge status: Home / Self Care | Payer: 59 | Source: Ambulatory Visit | Attending: Surgery | Admitting: Surgery

## 2018-03-06 DIAGNOSIS — N6321 Unspecified lump in the left breast, upper outer quadrant: Secondary | ICD-10-CM | POA: Diagnosis not present

## 2018-03-06 DIAGNOSIS — N6320 Unspecified lump in the left breast, unspecified quadrant: Secondary | ICD-10-CM

## 2018-03-06 HISTORY — PX: BREAST EXCISIONAL BIOPSY: SUR124

## 2018-03-06 MED ORDER — CEFAZOLIN SODIUM-DEXTROSE 2-4 GM/100ML-% IV SOLN
2.0000 g | INTRAVENOUS | Status: AC
  Administered 2018-03-07: 2 g via INTRAVENOUS
  Filled 2018-03-06: qty 100

## 2018-03-07 ENCOUNTER — Ambulatory Visit (HOSPITAL_COMMUNITY)
Admission: RE | Admit: 2018-03-07 | Discharge: 2018-03-07 | Disposition: A | Discharge status: Home / Self Care | Payer: 59 | Source: Ambulatory Visit | Attending: Surgery | Admitting: Surgery

## 2018-03-07 ENCOUNTER — Ambulatory Visit (HOSPITAL_COMMUNITY): Payer: 59 | Admitting: Anesthesiology

## 2018-03-07 ENCOUNTER — Encounter (HOSPITAL_COMMUNITY)
Admission: RE | Disposition: A | Discharge status: Home / Self Care | Payer: Self-pay | Source: Ambulatory Visit | Attending: Surgery

## 2018-03-07 ENCOUNTER — Ambulatory Visit
Admission: RE | Admit: 2018-03-07 | Discharge: 2018-03-07 | Disposition: A | Discharge status: Home / Self Care | Payer: 59 | Source: Ambulatory Visit | Attending: Surgery | Admitting: Surgery

## 2018-03-07 ENCOUNTER — Encounter (HOSPITAL_COMMUNITY): Payer: Self-pay | Admitting: *Deleted

## 2018-03-07 DIAGNOSIS — E039 Hypothyroidism, unspecified: Secondary | ICD-10-CM | POA: Diagnosis not present

## 2018-03-07 DIAGNOSIS — Z87891 Personal history of nicotine dependence: Secondary | ICD-10-CM | POA: Diagnosis not present

## 2018-03-07 DIAGNOSIS — N62 Hypertrophy of breast: Secondary | ICD-10-CM | POA: Insufficient documentation

## 2018-03-07 DIAGNOSIS — E559 Vitamin D deficiency, unspecified: Secondary | ICD-10-CM | POA: Diagnosis not present

## 2018-03-07 DIAGNOSIS — Z6836 Body mass index (BMI) 36.0-36.9, adult: Secondary | ICD-10-CM | POA: Insufficient documentation

## 2018-03-07 DIAGNOSIS — M797 Fibromyalgia: Secondary | ICD-10-CM | POA: Diagnosis not present

## 2018-03-07 DIAGNOSIS — Z79890 Hormone replacement therapy (postmenopausal): Secondary | ICD-10-CM | POA: Insufficient documentation

## 2018-03-07 DIAGNOSIS — C50912 Malignant neoplasm of unspecified site of left female breast: Secondary | ICD-10-CM | POA: Diagnosis not present

## 2018-03-07 DIAGNOSIS — N6320 Unspecified lump in the left breast, unspecified quadrant: Secondary | ICD-10-CM

## 2018-03-07 DIAGNOSIS — N6321 Unspecified lump in the left breast, upper outer quadrant: Secondary | ICD-10-CM | POA: Diagnosis present

## 2018-03-07 DIAGNOSIS — D242 Benign neoplasm of left breast: Secondary | ICD-10-CM | POA: Diagnosis not present

## 2018-03-07 DIAGNOSIS — R928 Other abnormal and inconclusive findings on diagnostic imaging of breast: Secondary | ICD-10-CM | POA: Diagnosis not present

## 2018-03-07 HISTORY — PX: BREAST LUMPECTOMY WITH RADIOACTIVE SEED LOCALIZATION: SHX6424

## 2018-03-07 LAB — POCT PREGNANCY, URINE: PREG TEST UR: NEGATIVE

## 2018-03-07 SURGERY — BREAST LUMPECTOMY WITH RADIOACTIVE SEED LOCALIZATION
Anesthesia: General | Site: Breast | Laterality: Left

## 2018-03-07 MED ORDER — LACTATED RINGERS IV SOLN
INTRAVENOUS | Status: DC | PRN
  Administered 2018-03-07: 13:00:00 via INTRAVENOUS

## 2018-03-07 MED ORDER — ONDANSETRON HCL 4 MG/2ML IJ SOLN
INTRAMUSCULAR | Status: AC
  Filled 2018-03-07: qty 2

## 2018-03-07 MED ORDER — DEXAMETHASONE SODIUM PHOSPHATE 10 MG/ML IJ SOLN
INTRAMUSCULAR | Status: AC
  Filled 2018-03-07: qty 1

## 2018-03-07 MED ORDER — SUGAMMADEX SODIUM 200 MG/2ML IV SOLN
INTRAVENOUS | Status: AC
  Filled 2018-03-07: qty 2

## 2018-03-07 MED ORDER — CHLORHEXIDINE GLUCONATE CLOTH 2 % EX PADS: 6.0000 | MEDICATED_PAD | Freq: Once | CUTANEOUS | Status: DC

## 2018-03-07 MED ORDER — MEPERIDINE HCL 50 MG/ML IJ SOLN
6.2500 mg | INTRAMUSCULAR | Status: DC | PRN
  Administered 2018-03-07: 12.5 mg via INTRAVENOUS

## 2018-03-07 MED ORDER — DEXAMETHASONE SODIUM PHOSPHATE 10 MG/ML IJ SOLN
INTRAMUSCULAR | Status: DC | PRN
  Administered 2018-03-07: 10 mg via INTRAVENOUS

## 2018-03-07 MED ORDER — ACETAMINOPHEN 500 MG PO TABS
1000.0000 mg | ORAL_TABLET | ORAL | Status: AC
  Administered 2018-03-07: 1000 mg via ORAL
  Filled 2018-03-07: qty 2

## 2018-03-07 MED ORDER — FENTANYL CITRATE (PF) 250 MCG/5ML IJ SOLN
INTRAMUSCULAR | Status: AC
  Filled 2018-03-07: qty 5

## 2018-03-07 MED ORDER — MIDAZOLAM HCL 2 MG/2ML IJ SOLN
INTRAMUSCULAR | Status: AC
  Filled 2018-03-07: qty 2

## 2018-03-07 MED ORDER — IBUPROFEN 800 MG PO TABS: 800.0000 mg | ORAL_TABLET | Freq: Three times a day (TID) | ORAL | 0 refills | Status: DC | PRN

## 2018-03-07 MED ORDER — LIDOCAINE HCL (CARDIAC) PF 100 MG/5ML IV SOSY
PREFILLED_SYRINGE | INTRAVENOUS | Status: DC | PRN
  Administered 2018-03-07: 80 mg via INTRAVENOUS

## 2018-03-07 MED ORDER — GABAPENTIN 300 MG PO CAPS
300.0000 mg | ORAL_CAPSULE | ORAL | Status: AC
  Administered 2018-03-07: 300 mg via ORAL
  Filled 2018-03-07: qty 1

## 2018-03-07 MED ORDER — ROCURONIUM BROMIDE 10 MG/ML (PF) SYRINGE
PREFILLED_SYRINGE | INTRAVENOUS | Status: AC
  Filled 2018-03-07: qty 10

## 2018-03-07 MED ORDER — ONDANSETRON HCL 4 MG/2ML IJ SOLN
INTRAMUSCULAR | Status: DC | PRN
  Administered 2018-03-07 (×2): 4 mg via INTRAVENOUS

## 2018-03-07 MED ORDER — PROPOFOL 10 MG/ML IV BOLUS
INTRAVENOUS | Status: AC
  Filled 2018-03-07: qty 20

## 2018-03-07 MED ORDER — LIDOCAINE 2% (20 MG/ML) 5 ML SYRINGE
INTRAMUSCULAR | Status: AC
  Filled 2018-03-07: qty 5

## 2018-03-07 MED ORDER — MEPERIDINE HCL 50 MG/ML IJ SOLN
INTRAMUSCULAR | Status: AC
  Filled 2018-03-07: qty 1

## 2018-03-07 MED ORDER — 0.9 % SODIUM CHLORIDE (POUR BTL) OPTIME
TOPICAL | Status: DC | PRN
  Administered 2018-03-07: 1000 mL

## 2018-03-07 MED ORDER — SCOPOLAMINE 1 MG/3DAYS TD PT72
MEDICATED_PATCH | TRANSDERMAL | Status: AC
  Administered 2018-03-07: 1.5 mg via TRANSDERMAL
  Filled 2018-03-07: qty 1

## 2018-03-07 MED ORDER — MIDAZOLAM HCL 2 MG/2ML IJ SOLN: 0.5000 mg | Freq: Once | INTRAMUSCULAR | Status: DC | PRN

## 2018-03-07 MED ORDER — PROMETHAZINE HCL 25 MG/ML IJ SOLN: 6.2500 mg | INTRAMUSCULAR | Status: DC | PRN

## 2018-03-07 MED ORDER — FENTANYL CITRATE (PF) 100 MCG/2ML IJ SOLN: 25.0000 ug | INTRAMUSCULAR | Status: DC | PRN

## 2018-03-07 MED ORDER — PROPOFOL 10 MG/ML IV BOLUS
INTRAVENOUS | Status: DC | PRN
  Administered 2018-03-07: 200 mg via INTRAVENOUS

## 2018-03-07 MED ORDER — BUPIVACAINE-EPINEPHRINE (PF) 0.25% -1:200000 IJ SOLN
INTRAMUSCULAR | Status: AC
  Filled 2018-03-07: qty 30

## 2018-03-07 MED ORDER — BUPIVACAINE-EPINEPHRINE 0.25% -1:200000 IJ SOLN
INTRAMUSCULAR | Status: DC | PRN
  Administered 2018-03-07: 20 mL

## 2018-03-07 MED ORDER — FENTANYL CITRATE (PF) 100 MCG/2ML IJ SOLN
INTRAMUSCULAR | Status: DC | PRN
  Administered 2018-03-07 (×2): 50 ug via INTRAVENOUS

## 2018-03-07 MED ORDER — MIDAZOLAM HCL 5 MG/5ML IJ SOLN
INTRAMUSCULAR | Status: DC | PRN
  Administered 2018-03-07: 2 mg via INTRAVENOUS

## 2018-03-07 MED ORDER — LACTATED RINGERS IV SOLN
INTRAVENOUS | Status: DC
  Administered 2018-03-07: 13:00:00 via INTRAVENOUS

## 2018-03-07 MED ORDER — SCOPOLAMINE 1 MG/3DAYS TD PT72
1.0000 | MEDICATED_PATCH | TRANSDERMAL | Status: DC
  Administered 2018-03-07: 1.5 mg via TRANSDERMAL
  Filled 2018-03-07: qty 1

## 2018-03-07 MED ORDER — OXYCODONE HCL 5 MG PO TABS: 5.0000 mg | ORAL_TABLET | Freq: Four times a day (QID) | ORAL | 0 refills | Status: DC | PRN

## 2018-03-07 MED ORDER — GLYCOPYRROLATE PF 0.2 MG/ML IJ SOSY
PREFILLED_SYRINGE | INTRAMUSCULAR | Status: AC
  Filled 2018-03-07: qty 1

## 2018-03-07 MED ORDER — CELECOXIB 200 MG PO CAPS
200.0000 mg | ORAL_CAPSULE | ORAL | Status: AC
  Administered 2018-03-07: 200 mg via ORAL
  Filled 2018-03-07: qty 1

## 2018-03-07 SURGICAL SUPPLY — 37 items
APPLIER CLIP 9.375 MED OPEN (MISCELLANEOUS) ×3
BINDER BREAST 3XL (GAUZE/BANDAGES/DRESSINGS) ×3 IMPLANT
BINDER BREAST XLRG (GAUZE/BANDAGES/DRESSINGS) ×3 IMPLANT
CANISTER SUCT 3000ML PPV (MISCELLANEOUS) ×3 IMPLANT
CHLORAPREP W/TINT 26ML (MISCELLANEOUS) ×6 IMPLANT
CLIP APPLIE 9.375 MED OPEN (MISCELLANEOUS) ×1 IMPLANT
COVER PROBE W GEL 5X96 (DRAPES) ×3 IMPLANT
COVER SURGICAL LIGHT HANDLE (MISCELLANEOUS) ×3 IMPLANT
DERMABOND ADVANCED (GAUZE/BANDAGES/DRESSINGS) ×2
DERMABOND ADVANCED .7 DNX12 (GAUZE/BANDAGES/DRESSINGS) ×1 IMPLANT
DEVICE DUBIN SPECIMEN MAMMOGRA (MISCELLANEOUS) ×3 IMPLANT
DRAPE CHEST BREAST 15X10 FENES (DRAPES) ×3 IMPLANT
ELECT CAUTERY BLADE 6.4 (BLADE) ×3 IMPLANT
ELECT REM PT RETURN 9FT ADLT (ELECTROSURGICAL) ×3
ELECTRODE REM PT RTRN 9FT ADLT (ELECTROSURGICAL) ×1 IMPLANT
GLOVE BIO SURGEON STRL SZ8 (GLOVE) ×3 IMPLANT
GLOVE BIOGEL PI IND STRL 8 (GLOVE) ×1 IMPLANT
GLOVE BIOGEL PI INDICATOR 8 (GLOVE) ×2
GOWN STRL REUS W/ TWL LRG LVL3 (GOWN DISPOSABLE) ×1 IMPLANT
GOWN STRL REUS W/ TWL XL LVL3 (GOWN DISPOSABLE) ×1 IMPLANT
GOWN STRL REUS W/TWL LRG LVL3 (GOWN DISPOSABLE) ×2
GOWN STRL REUS W/TWL XL LVL3 (GOWN DISPOSABLE) ×2
KIT BASIN OR (CUSTOM PROCEDURE TRAY) ×3 IMPLANT
KIT MARKER MARGIN INK (KITS) ×3 IMPLANT
NEEDLE HYPO 25GX1X1/2 BEV (NEEDLE) ×3 IMPLANT
NS IRRIG 1000ML POUR BTL (IV SOLUTION) ×3 IMPLANT
PACK GENERAL/GYN (CUSTOM PROCEDURE TRAY) ×3 IMPLANT
PENCIL BUTTON HOLSTER BLD 10FT (ELECTRODE) ×3 IMPLANT
SUT MNCRL AB 4-0 PS2 18 (SUTURE) ×3 IMPLANT
SUT VIC AB 2-0 SH 27 (SUTURE) ×2
SUT VIC AB 2-0 SH 27XBRD (SUTURE) ×1 IMPLANT
SUT VIC AB 3-0 SH 27 (SUTURE) ×2
SUT VIC AB 3-0 SH 27X BRD (SUTURE) ×1 IMPLANT
SYR BULB 3OZ (MISCELLANEOUS) ×3 IMPLANT
SYR CONTROL 10ML LL (SYRINGE) ×3 IMPLANT
TOWEL NATURAL 6PK STERILE (DISPOSABLE) ×3 IMPLANT
YANKAUER SUCT BULB TIP NO VENT (SUCTIONS) ×3 IMPLANT

## 2018-03-07 NOTE — Interval H&P Note (Signed)
History and Physical Interval Note:  03/07/2018 1:48 PM  Melanie Rose  has presented today for surgery, with the diagnosis of LEFT BREAST MASS  The various methods of treatment have been discussed with the patient and family. After consideration of risks, benefits and other options for treatment, the patient has consented to  Procedure(s): LEFT BREAST LUMPECTOMY WITH RADIOACTIVE SEED LOCALIZATION (Left) as a surgical intervention .  The patient's history has been reviewed, patient examined, no change in status, stable for surgery.  I have reviewed the patient's chart and labs.  Questions were answered to the patient's satisfaction.     [place]

## 2018-03-07 NOTE — Anesthesia Postprocedure Evaluation (Signed)
Anesthesia Post Note  Patient: Melanie Rose  Procedure(s) Performed: LEFT BREAST LUMPECTOMY WITH RADIOACTIVE SEED LOCALIZATION (Left Breast)     Patient location during evaluation: PACU Anesthesia Type: General Level of consciousness: awake and alert, oriented and patient cooperative Pain management: pain level controlled Vital Signs Assessment: post-procedure vital signs reviewed and stable Respiratory status: spontaneous breathing, nonlabored ventilation and respiratory function stable Cardiovascular status: blood pressure returned to baseline and stable Postop Assessment: no apparent nausea or vomiting Anesthetic complications: no    Last Vitals:  Vitals:   03/07/18 1536 03/07/18 1608  BP: 98/65 117/82  Pulse: 86 84  Resp: (!) 4 16  Temp:  36.7 C  SpO2: 100% 98%    Last Pain:  Vitals:   03/07/18 1608  TempSrc:   PainSc: 0-No pain                 Steffon Gladu,E. Advay Volante

## 2018-03-07 NOTE — Anesthesia Procedure Notes (Signed)
Procedure Name: LMA Insertion Date/Time: 03/07/2018 2:23 PM Performed by: Neldon Newport, CRNA Pre-anesthesia Checklist: Timeout performed, Patient being monitored, Suction available, Emergency Drugs available and Patient identified Patient Re-evaluated:Patient Re-evaluated prior to induction Oxygen Delivery Method: Circle system utilized Preoxygenation: Pre-oxygenation with 100% oxygen Induction Type: IV induction Ventilation: Mask ventilation without difficulty LMA: LMA inserted LMA Size: 4.0 Number of attempts: 1 Placement Confirmation: breath sounds checked- equal and bilateral and positive ETCO2 Tube secured with: Tape Dental Injury: Teeth and Oropharynx as per pre-operative assessment

## 2018-03-07 NOTE — Discharge Instructions (Addendum)
[facility] °Office Phone Number [phone] ° °BREAST BIOPSY/ PARTIAL MASTECTOMY: POST OP INSTRUCTIONS ° °Always review your discharge instruction sheet given to you by the facility where your surgery was performed. ° °IF YOU HAVE DISABILITY OR FAMILY LEAVE FORMS, YOU MUST BRING THEM TO THE OFFICE FOR PROCESSING.  DO NOT GIVE THEM TO YOUR DOCTOR. ° °1. A prescription for pain medication may be given to you upon discharge.  Take your pain medication as prescribed, if needed.  If narcotic pain medicine is not needed, then you may take acetaminophen (Tylenol) or ibuprofen (Advil) as needed. °2. Take your usually prescribed medications unless otherwise directed °3. If you need a refill on your pain medication, please contact your pharmacy.  They will contact our office to request authorization.  Prescriptions will not be filled after 5pm or on week-ends. °4. You should eat very light the first 24 hours after surgery, such as soup, crackers, pudding, etc.  Resume your normal diet the day after surgery. °5. Most patients will experience some swelling and bruising in the breast.  Ice packs and a good support bra will help.  Swelling and bruising can take several days to resolve.  °6. It is common to experience some constipation if taking pain medication after surgery.  Increasing fluid intake and taking a stool softener will usually help or prevent this problem from occurring.  A mild laxative (Milk of Magnesia or Miralax) should be taken according to package directions if there are no bowel movements after 48 hours. °7. Unless discharge instructions indicate otherwise, you may remove your bandages 24-48 hours after surgery, and you may shower at that time.  You may have steri-strips (small skin tapes) in place directly over the incision.  These strips should be left on the skin for 7-10 days.  If your surgeon used skin glue on the incision, you may shower in 24 hours.  The glue will flake off over the  next 2-3 weeks.  Any sutures or staples will be removed at the office during your follow-up visit. °8. ACTIVITIES:  You may resume regular daily activities (gradually increasing) beginning the next day.  Wearing a good support bra or sports bra minimizes pain and swelling.  You may have sexual intercourse when it is comfortable. °a. You may drive when you no longer are taking prescription pain medication, you can comfortably wear a seatbelt, and you can safely maneuver your car and apply brakes. °b. RETURN TO WORK:  ______________________________________________________________________________________ °9. You should see your doctor in the office for a follow-up appointment approximately two weeks after your surgery.  Your doctor’s nurse will typically make your follow-up appointment when she calls you with your pathology report.  Expect your pathology report 2-3 business days after your surgery.  You may call to check if you do not hear from us after three days. °10. OTHER INSTRUCTIONS: _______________________________________________________________________________________________ _____________________________________________________________________________________________________________________________________ °_____________________________________________________________________________________________________________________________________ °_____________________________________________________________________________________________________________________________________ ° °WHEN TO CALL YOUR DOCTOR: °1. Fever over 101.0 °2. Nausea and/or vomiting. °3. Extreme swelling or bruising. °4. Continued bleeding from incision. °5. Increased pain, redness, or drainage from the incision. ° °The clinic staff is available to answer your questions during regular business hours.  Please don’t hesitate to call and ask to speak to one of the nurses for clinical concerns.  If you have a medical emergency, go to the nearest  emergency room or call 911.  A surgeon from [facility] is always on call at the hospital. ° °For further questions, please visit [email or web address]  °

## 2018-03-07 NOTE — Transfer of Care (Signed)
Immediate Anesthesia Transfer of Care Note  Patient: Melanie Rose  Procedure(s) Performed: LEFT BREAST LUMPECTOMY WITH RADIOACTIVE SEED LOCALIZATION (Left Breast)  Patient Location: PACU  Anesthesia Type:General  Level of Consciousness: drowsy and patient cooperative  Airway & Oxygen Therapy: Patient Spontanous Breathing and Patient connected to face mask oxygen  Post-op Assessment: Report given to RN and Post -op Vital signs reviewed and stable  Post vital signs: Reviewed and stable  Last Vitals:  Vitals Value Taken Time  BP 96/73 03/07/2018  3:08 PM  Temp 36.7 C 03/07/2018  3:08 PM  Pulse 93 03/07/2018  3:09 PM  Resp 17 03/07/2018  3:09 PM  SpO2 98 % 03/07/2018  3:09 PM  Vitals shown include unvalidated device data.  Last Pain:  Vitals:   03/07/18 1508  TempSrc:   PainSc: Asleep         Complications: No apparent anesthesia complications

## 2018-03-07 NOTE — Anesthesia Preprocedure Evaluation (Addendum)
Anesthesia Evaluation  Patient identified by MRN, date of birth, ID band Patient awake    Reviewed: Allergy & Precautions, NPO status , Patient's Chart, lab work & pertinent test results  History of Anesthesia Complications Negative for: history of anesthetic complications  Airway Mallampati: II  TM Distance: >3 FB Neck ROM: Full    Dental  (+) Dental Advisory Given   Pulmonary former smoker,    breath sounds clear to auscultation       Cardiovascular negative cardio ROS   Rhythm:Regular Rate:Normal     Neuro/Psych  Headaches,    GI/Hepatic Neg liver ROS, GERD  Controlled,  Endo/Other  Hypothyroidism Morbid obesity  Renal/GU negative Renal ROS     Musculoskeletal  (+) Fibromyalgia -  Abdominal (+) + obese,   Peds  Hematology negative hematology ROS (+)   Anesthesia Other Findings   Reproductive/Obstetrics S/p BTL                           Anesthesia Physical Anesthesia Plan  ASA: II  Anesthesia Plan: General   Post-op Pain Management:    Induction: Intravenous  PONV Risk Score and Plan: 3 and Ondansetron, Dexamethasone and Scopolamine patch - Pre-op  Airway Management Planned: LMA  Additional Equipment:   Intra-op Plan:   Post-operative Plan:   Informed Consent: I have reviewed the patients History and Physical, chart, labs and discussed the procedure including the risks, benefits and alternatives for the proposed anesthesia with the patient or authorized representative who has indicated his/her understanding and acceptance.   Dental advisory given  Plan Discussed with: CRNA and Surgeon  Anesthesia Plan Comments: (Plan routine monitors, GA- LMA OK)        Anesthesia Quick Evaluation

## 2018-03-07 NOTE — Op Note (Signed)
Preoperative diagnosis: Left breast mass  Postoperative diagnosis: Same   Procedure: Left breast seed localized lumpectomy  Surgeon: Erroll Luna M.D.  Anesthesia: Gen. With 0.25% Sensorcaine local with epinephrine   EBL: 20 cc  Specimen: Left breast tissue with clip and radioactive seed in the specimen. 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 Discussed the rationale for considering excision. Small risk of malignancy associated with this lesion  after core biopsy. Discussed observation. Discussed wire localization. Patient desired excision.The procedure has been discussed with the patient. Alternatives to surgery have been discussed with the patient.  Risks of surgery include bleeding,  Infection,  Seroma formation, death,  and the need for further surgery.   The patient understands and wishes to proceed.   Description of procedure: Patient underwent seed placement as an outpatient. Patient presents today for left breast seed localized lumpectomy. Patient seen in the  holding area. Questions are answered and neoprobe used to verify seed location. Patient taken back to the operating room and placed upon the OR table. After induction of general anesthesia, left breast prepped and draped in a sterile fashion. Timeout was done to verify proper  procedure. Neoprobe used and hot spot identified and left breast upper-outer quadrant. This was marked with pen. Curvilinear incision made left upper outer quadrant breast. Dissection used with the help of a neoprobe around the tissue where the seed and clip were located. Tissue removed in its entirety with gross margins.. Neoprobe used and seed within specimen. Radiographs taken which show clip and seed  In the  specimen.Hemostasis achieved and cavity closed with 3-0 Vicryl and  4-0 Monocryl. Dermabond applied. All final counts found to be correct. Specimen transported to pathology. Patient awoke extubated taken to recovery in satisfactory condition.

## 2018-03-08 ENCOUNTER — Encounter (HOSPITAL_COMMUNITY): Payer: Self-pay | Admitting: Surgery

## 2018-03-20 ENCOUNTER — Ambulatory Visit (INDEPENDENT_AMBULATORY_CARE_PROVIDER_SITE_OTHER): Payer: 59 | Admitting: Adult Health

## 2018-03-20 ENCOUNTER — Other Ambulatory Visit (HOSPITAL_COMMUNITY)
Admission: RE | Admit: 2018-03-20 | Discharge: 2018-03-20 | Disposition: A | Discharge status: Home / Self Care | Payer: 59 | Source: Ambulatory Visit | Attending: Adult Health | Admitting: Adult Health

## 2018-03-20 ENCOUNTER — Encounter: Payer: Self-pay | Admitting: Adult Health

## 2018-03-20 VITALS — BP 113/79 | HR 81 | Ht 60.75 in | Wt 180.0 lb

## 2018-03-20 DIAGNOSIS — Z01419 Encounter for gynecological examination (general) (routine) without abnormal findings: Secondary | ICD-10-CM | POA: Insufficient documentation

## 2018-03-20 DIAGNOSIS — Z87898 Personal history of other specified conditions: Secondary | ICD-10-CM | POA: Diagnosis not present

## 2018-03-20 DIAGNOSIS — Z8742 Personal history of other diseases of the female genital tract: Secondary | ICD-10-CM

## 2018-03-20 NOTE — Progress Notes (Signed)
Patient ID: Melanie Rose, female   DOB: [birth year], 37 y.o.   MRN: [id number] History of Present Illness: Melanie Rose is a 37 year old white female, married, G2P2 in for well woman gyn exam and pap. PCP is Dr Lajuana Ripple.   Current Medications, Allergies, Past Medical History, Past Surgical History, Family History and Social History were reviewed in [employer] record.     Review of Systems:  Patient denies any hearing loss, fatigue, blurred vision, shortness of breath, chest pain, abdominal pain, problems with bowel movements, urination, or intercourse. No joint pain or mood swings. Has tension like headache before period Decreased libido   Physical Exam:BP 113/79 (BP Location: Left Arm, Patient Position: Sitting, Cuff Size: Normal)   Pulse 81   Ht 5' 0.75" (1.543 m)   Wt 180 lb (81.6 kg)   LMP 03/14/2018   Breastfeeding? No   BMI 34.29 kg/m  General:  Well developed, well nourished, no acute distress Skin:  Warm and dry Neck:  Midline trachea, normal thyroid, good ROM, no lymphadenopathy Lungs; Clear to auscultation bilaterally Breast:  No dominant palpable mass, retraction, or nipple discharge, haling scar left breast at 1-2 o'clock, has large breasts Cardiovascular: Regular rate and rhythm Abdomen:  Soft, non tender, no hepatosplenomegaly Pelvic:  External genitalia is normal in appearance, no lesions.  The vagina is normal in appearance. Urethra has no lesions or masses. The cervix is bulbous. Pap with HPV performed.  Uterus is felt to be normal size, shape, and contour.  No adnexal masses or tenderness noted.Bladder is non tender, no masses felt. Extremities/musculoskeletal:  No swelling or varicosities noted, no clubbing or cyanosis Psych:  No mood changes, alert and cooperative,seems happy PHQ 2 score 0.   Impression: 1. Encounter for gynecological examination with Papanicolaou smear of cervix   2. History of abnormal cervical Pap smear        Plan: Increase sexual activity and have a date night  Physical in 1 year Pap in 3 if normal Labs with PCP Mammogram at 37

## 2018-03-21 LAB — CYTOLOGY - PAP
ADEQUACY: ABSENT
Diagnosis: NEGATIVE
HPV (WINDOPATH): NOT DETECTED

## 2018-06-19 ENCOUNTER — Ambulatory Visit (INDEPENDENT_AMBULATORY_CARE_PROVIDER_SITE_OTHER): Payer: 59

## 2018-06-19 DIAGNOSIS — Z23 Encounter for immunization: Secondary | ICD-10-CM

## 2018-07-24 ENCOUNTER — Encounter: Payer: Self-pay | Admitting: Family Medicine

## 2018-07-24 ENCOUNTER — Ambulatory Visit (INDEPENDENT_AMBULATORY_CARE_PROVIDER_SITE_OTHER): Payer: 59

## 2018-07-24 ENCOUNTER — Ambulatory Visit: Payer: 59 | Admitting: Family Medicine

## 2018-07-24 VITALS — BP 117/75 | HR 84 | Temp 99.0°F | Ht 60.0 in | Wt 172.0 lb

## 2018-07-24 DIAGNOSIS — R52 Pain, unspecified: Secondary | ICD-10-CM | POA: Diagnosis not present

## 2018-07-24 DIAGNOSIS — M4125 Other idiopathic scoliosis, thoracolumbar region: Secondary | ICD-10-CM

## 2018-07-24 DIAGNOSIS — G8929 Other chronic pain: Secondary | ICD-10-CM

## 2018-07-24 DIAGNOSIS — M5442 Lumbago with sciatica, left side: Secondary | ICD-10-CM | POA: Diagnosis not present

## 2018-07-24 DIAGNOSIS — M4185 Other forms of scoliosis, thoracolumbar region: Secondary | ICD-10-CM | POA: Diagnosis not present

## 2018-07-24 MED ORDER — PREDNISONE 10 MG (21) PO TBPK: ORAL_TABLET | ORAL | 0 refills | Status: DC

## 2018-07-24 NOTE — Progress Notes (Signed)
Subjective: CC: back pain PCP: Janora Norlander, DO HPI: Patient is a 37 y.o. female presenting to clinic today for back pain. Concerns today include:  1. Back Pain Patient reports that pain began about 1 week ago.  She notes that she was doing yoga and felt some stiffness in the left low back.  She is had a similar episode this past summer.  It does radiate to the left lower extremity.  Nothing specifically worsens pain.  Nothing specifically improves pain.  Patient denies trauma or injury.   Denies saddle anesthesia, urinary retention/incontinence, bowel incontinence, weakness, falls, sensation changes or pain anywhere else.  No h/o back surgeries.  She does report a history of scoliosis.  She has previously been braced for this.  She also used to see a Restaurant manager, fast food.   Current Outpatient Medications:  .  levothyroxine (SYNTHROID, LEVOTHROID) 100 MCG tablet, TAKE 1 TABLET BY MOUTH  EVERY DAY, Disp: , Rfl:  No Known Allergies  Past Medical History:  Diagnosis Date  . Abnormal Pap smear   . Breast mass in female    Left  . Chronic kidney disease    kidney stones  . Constipation   . Fibromyalgia   . Generalized headaches   . GERD (gastroesophageal reflux disease)   . Hemorrhoids   . History of kidney stones   . Hypothyroidism    sp radioactive  iodine therapy for hyperthyroidism   . Vaginal Pap smear, abnormal    Social History   Socioeconomic History  . Marital status: Married    Spouse name: Not on file  . Number of children: Not on file  . Years of education: Not on file  . Highest education level: Not on file  Occupational History  . Not on file  Social Needs  . Financial resource strain: Not on file  . Food insecurity:    Worry: Not on file    Inability: Not on file  . Transportation needs:    Medical: Not on file    Non-medical: Not on file  Tobacco Use  . Smoking status: Former Smoker    Packs/day: 1.00    Years: 10.00    Pack years: 10.00   Types: Cigarettes    Last attempt to quit: 06/22/2009    Years since quitting: 9.0  . Smokeless tobacco: Never Used  Substance and Sexual Activity  . Alcohol use: No  . Drug use: No  . Sexual activity: Yes    Birth control/protection: Surgical    Comment: tubal  Lifestyle  . Physical activity:    Days per week: Not on file    Minutes per session: Not on file  . Stress: Not on file  Relationships  . Social connections:    Talks on phone: Not on file    Gets together: Not on file    Attends religious service: Not on file    Active member of club or organization: Not on file    Attends meetings of clubs or organizations: Not on file    Relationship status: Not on file  . Intimate partner violence:    Fear of current or ex partner: Not on file    Emotionally abused: Not on file    Physically abused: Not on file    Forced sexual activity: Not on file  Other Topics Concern  . Not on file  Social History Narrative   Ms Orsak is a pleasant female who is married and has 2 children, son age  29, daughter age 19.  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   Past Surgical History:  Procedure Laterality Date  . BREAST LUMPECTOMY WITH RADIOACTIVE SEED LOCALIZATION Left 03/07/2018   Procedure: LEFT BREAST LUMPECTOMY WITH RADIOACTIVE SEED LOCALIZATION;  Surgeon: Erroll Luna, MD;  Location: [facility];  Service: General;  Laterality: Left;  . LAPAROSCOPIC TUBAL LIGATION Bilateral 01/16/2015   Procedure: LAPAROSCOPIC TUBAL LIGATION;  Surgeon: Newton Pigg, MD;  Location: [facility];  Service: Gynecology;  Laterality: Bilateral;  . TUBAL LIGATION      ROS: per HPI  Objective: Office vital signs reviewed. BP 117/75   Pulse 84   Temp 99 F (37.2 C) (Oral)   Ht 5' (1.524 m)   Wt 172 lb (78 kg)   BMI 33.59 kg/m   Physical Examination:  General: Awake, alert, well nourished, NAD Pulm: normal work of breathing on room air Extremities: Warm, well-perfused. No edema,  cyanosis or clubbing; +2 pulses bilaterally MSK: normal gait and station  Lumbar Spine: She has limited AROM with rotation to the left and in extension, no midline tenderness to palpation, left-sided lumbosacral and sacroiliac paraspinal tenderness to palpation.  She has a visible and palpable scoliotic curve to the right in the lower thoracic and upper lumbar region Neuro: No focal neurologic deficits  No results found.  Assessment/ Plan: CHAUNCEY BRUNO is a 37 y.o. female here with  1. Chronic left-sided low back pain with left-sided sciatica X-ray was obtained given chronicity.  Personal review demonstrated scoliotic curve to the right.  I suspect that her pain is likely related to underlying scoliosis.  I have prescribed her prednisone pack, since this helped in the past.  I have also placed a referral to orthopedic surgery for further evaluation and management.  We discussed that she may need to revisit physical therapy and that we could set this up next door if she desired.  Okay to continue yoga but would avoid exacerbating stretches. - DG Lumbar Spine 2-3 Views; Future - Ambulatory referral to Orthopedic Surgery  2. Other idiopathic scoliosis, thoracolumbar region Has been present since she was a child.  She states during exam that used to be a 7 degree angle.  This appears to be larger than 7 degrees at this point. - Ambulatory referral to [facility] [place]

## 2018-07-24 NOTE — Patient Instructions (Signed)
Scoliosis Scoliosis is the name given to a spine that curves sideways.Scoliosis can cause twisting of your shoulders, hips, chest, back, and rib cage. What are the causes? The cause of scoliosis is not always known. It may be caused by a birth defect or by a disease that can cause muscular dysfunction and imbalance, such as cerebral palsy and muscular dystrophy. What increases the risk? Having a disease that causes muscle disease or dysfunction. What are the signs or symptoms? Scoliosis often has no signs or symptoms.If they are present, they may include:  Unequal size of one body side compared to the other (asymmetry).  Visible curvature of the spine.  Pain. The pain may limit physical activity.  Shortness of breath.  Bowel or bladder issues.  How is this diagnosed? A skilled health care provider will perform an evaluation. This will involve:  Taking your history.  Performing a physical examination.  Performing a neurological exam to detect nerve or muscle function loss.  Range of motion studies on the spine.  X-rays.  An MRI may also be obtained. How is this treated? Treatment varies depending on the nature, extent, and severity of the disease. If the curvature is not great, you may need only observation. A brace may be used to prevent scoliosis from progressing. A brace may also be needed during growth spurts. Physical therapy may be of benefit. Surgery may be required. Follow these instructions at home:  Your health care provider may suggest exercises to strengthen your muscles. Perform them as directed.  Ask your health care provider before participating in any sports.  If you have been prescribed an orthopedic brace, wear it as instructed by your health care provider. Contact a health care provider if: Your brace causes the skin to become sore (chafe) or is uncomfortable. Get help right away if:  You have back pain that is not relieved by the medicines prescribed  by your health care provider.  Your legs feel weak or you lose function in your legs.  You lose some bowel or bladder control. This information is not intended to replace advice given to you by your health care provider. Make sure you discuss any questions you have with your health care provider. Document Released: 08/20/2000 Document Revised: 01/29/2016 Document Reviewed: 02/26/2016 Elsevier Interactive Patient Education  2018 [employer]  

## 2018-07-26 ENCOUNTER — Encounter: Payer: Self-pay | Admitting: Family Medicine

## 2018-07-31 DIAGNOSIS — M62830 Muscle spasm of back: Secondary | ICD-10-CM | POA: Diagnosis not present

## 2018-07-31 DIAGNOSIS — M9902 Segmental and somatic dysfunction of thoracic region: Secondary | ICD-10-CM | POA: Diagnosis not present

## 2018-08-01 DIAGNOSIS — M9902 Segmental and somatic dysfunction of thoracic region: Secondary | ICD-10-CM | POA: Diagnosis not present

## 2018-08-01 DIAGNOSIS — M62830 Muscle spasm of back: Secondary | ICD-10-CM | POA: Diagnosis not present

## 2018-08-02 DIAGNOSIS — M62830 Muscle spasm of back: Secondary | ICD-10-CM | POA: Diagnosis not present

## 2018-08-02 DIAGNOSIS — M9902 Segmental and somatic dysfunction of thoracic region: Secondary | ICD-10-CM | POA: Diagnosis not present

## 2018-08-07 DIAGNOSIS — M9902 Segmental and somatic dysfunction of thoracic region: Secondary | ICD-10-CM | POA: Diagnosis not present

## 2018-08-07 DIAGNOSIS — M9904 Segmental and somatic dysfunction of sacral region: Secondary | ICD-10-CM | POA: Diagnosis not present

## 2018-08-07 DIAGNOSIS — M62830 Muscle spasm of back: Secondary | ICD-10-CM | POA: Diagnosis not present

## 2018-08-07 DIAGNOSIS — M9903 Segmental and somatic dysfunction of lumbar region: Secondary | ICD-10-CM | POA: Diagnosis not present

## 2018-08-08 DIAGNOSIS — M9904 Segmental and somatic dysfunction of sacral region: Secondary | ICD-10-CM | POA: Diagnosis not present

## 2018-08-08 DIAGNOSIS — M62830 Muscle spasm of back: Secondary | ICD-10-CM | POA: Diagnosis not present

## 2018-08-08 DIAGNOSIS — M9903 Segmental and somatic dysfunction of lumbar region: Secondary | ICD-10-CM | POA: Diagnosis not present

## 2018-08-08 DIAGNOSIS — M9902 Segmental and somatic dysfunction of thoracic region: Secondary | ICD-10-CM | POA: Diagnosis not present

## 2018-08-10 DIAGNOSIS — M9902 Segmental and somatic dysfunction of thoracic region: Secondary | ICD-10-CM | POA: Diagnosis not present

## 2018-08-10 DIAGNOSIS — M9904 Segmental and somatic dysfunction of sacral region: Secondary | ICD-10-CM | POA: Diagnosis not present

## 2018-08-10 DIAGNOSIS — M9903 Segmental and somatic dysfunction of lumbar region: Secondary | ICD-10-CM | POA: Diagnosis not present

## 2018-08-10 DIAGNOSIS — M62830 Muscle spasm of back: Secondary | ICD-10-CM | POA: Diagnosis not present

## 2018-08-14 DIAGNOSIS — M9903 Segmental and somatic dysfunction of lumbar region: Secondary | ICD-10-CM | POA: Diagnosis not present

## 2018-08-14 DIAGNOSIS — M62830 Muscle spasm of back: Secondary | ICD-10-CM | POA: Diagnosis not present

## 2018-08-14 DIAGNOSIS — M9902 Segmental and somatic dysfunction of thoracic region: Secondary | ICD-10-CM | POA: Diagnosis not present

## 2018-08-14 DIAGNOSIS — M9904 Segmental and somatic dysfunction of sacral region: Secondary | ICD-10-CM | POA: Diagnosis not present

## 2018-08-15 DIAGNOSIS — M9903 Segmental and somatic dysfunction of lumbar region: Secondary | ICD-10-CM | POA: Diagnosis not present

## 2018-08-15 DIAGNOSIS — M9902 Segmental and somatic dysfunction of thoracic region: Secondary | ICD-10-CM | POA: Diagnosis not present

## 2018-08-15 DIAGNOSIS — M62830 Muscle spasm of back: Secondary | ICD-10-CM | POA: Diagnosis not present

## 2018-08-15 DIAGNOSIS — M9904 Segmental and somatic dysfunction of sacral region: Secondary | ICD-10-CM | POA: Diagnosis not present

## 2018-08-17 DIAGNOSIS — M9903 Segmental and somatic dysfunction of lumbar region: Secondary | ICD-10-CM | POA: Diagnosis not present

## 2018-08-17 DIAGNOSIS — M9902 Segmental and somatic dysfunction of thoracic region: Secondary | ICD-10-CM | POA: Diagnosis not present

## 2018-08-17 DIAGNOSIS — M9904 Segmental and somatic dysfunction of sacral region: Secondary | ICD-10-CM | POA: Diagnosis not present

## 2018-08-17 DIAGNOSIS — M62830 Muscle spasm of back: Secondary | ICD-10-CM | POA: Diagnosis not present

## 2018-08-22 DIAGNOSIS — M9904 Segmental and somatic dysfunction of sacral region: Secondary | ICD-10-CM | POA: Diagnosis not present

## 2018-08-22 DIAGNOSIS — M9902 Segmental and somatic dysfunction of thoracic region: Secondary | ICD-10-CM | POA: Diagnosis not present

## 2018-08-22 DIAGNOSIS — M62830 Muscle spasm of back: Secondary | ICD-10-CM | POA: Diagnosis not present

## 2018-08-22 DIAGNOSIS — M9903 Segmental and somatic dysfunction of lumbar region: Secondary | ICD-10-CM | POA: Diagnosis not present

## 2018-08-24 DIAGNOSIS — M9903 Segmental and somatic dysfunction of lumbar region: Secondary | ICD-10-CM | POA: Diagnosis not present

## 2018-08-24 DIAGNOSIS — M9902 Segmental and somatic dysfunction of thoracic region: Secondary | ICD-10-CM | POA: Diagnosis not present

## 2018-08-24 DIAGNOSIS — M62830 Muscle spasm of back: Secondary | ICD-10-CM | POA: Diagnosis not present

## 2018-08-24 DIAGNOSIS — M9904 Segmental and somatic dysfunction of sacral region: Secondary | ICD-10-CM | POA: Diagnosis not present

## 2018-08-28 ENCOUNTER — Encounter: Payer: Self-pay | Admitting: "Endocrinology

## 2018-08-28 ENCOUNTER — Ambulatory Visit: Payer: 59 | Admitting: "Endocrinology

## 2018-08-28 ENCOUNTER — Other Ambulatory Visit: Payer: Self-pay | Admitting: "Endocrinology

## 2018-08-28 VITALS — BP 104/73 | HR 77 | Resp 12 | Ht 60.0 in | Wt 171.2 lb

## 2018-08-28 DIAGNOSIS — E890 Postprocedural hypothyroidism: Secondary | ICD-10-CM

## 2018-08-28 DIAGNOSIS — M9903 Segmental and somatic dysfunction of lumbar region: Secondary | ICD-10-CM | POA: Diagnosis not present

## 2018-08-28 DIAGNOSIS — M9904 Segmental and somatic dysfunction of sacral region: Secondary | ICD-10-CM | POA: Diagnosis not present

## 2018-08-28 DIAGNOSIS — M9902 Segmental and somatic dysfunction of thoracic region: Secondary | ICD-10-CM | POA: Diagnosis not present

## 2018-08-28 DIAGNOSIS — M62830 Muscle spasm of back: Secondary | ICD-10-CM | POA: Diagnosis not present

## 2018-08-28 HISTORY — DX: Postprocedural hypothyroidism: E89.0

## 2018-08-28 NOTE — Progress Notes (Signed)
Endocrinology Consult Note                                         08/28/2018, 1:23 PM   Melanie Rose is a 37 y.o.-year-old female patient being seen in consultation for hypothyroidism referred by Melanie Norlander, DO.   Past Medical History:  Diagnosis Date  . Abnormal Pap smear   . Breast mass in female    Left  . Chronic kidney disease    kidney stones  . Constipation   . Fibromyalgia   . Generalized headaches   . GERD (gastroesophageal reflux disease)   . Hemorrhoids   . History of kidney stones   . Hypothyroidism    sp radioactive  iodine therapy for hyperthyroidism   . Vaginal Pap smear, abnormal    Past Surgical History:  Procedure Laterality Date  . BREAST LUMPECTOMY WITH RADIOACTIVE SEED LOCALIZATION Left 03/07/2018   Procedure: LEFT BREAST LUMPECTOMY WITH RADIOACTIVE SEED LOCALIZATION;  Surgeon: Erroll Luna, MD;  Location: [facility];  Service: General;  Laterality: Left;  . LAPAROSCOPIC TUBAL LIGATION Bilateral 01/16/2015   Procedure: LAPAROSCOPIC TUBAL LIGATION;  Surgeon: Newton Pigg, MD;  Location: [facility];  Service: Gynecology;  Laterality: Bilateral;  . TUBAL LIGATION     Social History   Socioeconomic History  . Marital status: Married    Spouse name: Not on file  . Number of children: Not on file  . Years of education: Not on file  . Highest education level: Not on file  Occupational History  . Not on file  Social Needs  . Financial resource strain: Not on file  . Food insecurity:    Worry: Not on file    Inability: Not on file  . Transportation needs:    Medical: Not on file    Non-medical: Not on file  Tobacco Use  . Smoking status: Former Smoker    Packs/day: 1.00    Years: 10.00    Pack years: 10.00    Types: Cigarettes    Last attempt to quit: 06/22/2009    Years since quitting: 9.1  . Smokeless tobacco: Never Used  Substance and Sexual Activity  . Alcohol  use: No  . Drug use: No  . Sexual activity: Yes    Birth control/protection: Surgical    Comment: tubal  Lifestyle  . Physical activity:    Days per week: Not on file    Minutes per session: Not on file  . Stress: Not on file  Relationships  . Social connections:    Talks on phone: Not on file    Gets together: Not on file    Attends religious service: Not on file    Active member of club or organization: Not on file    Attends meetings of clubs or organizations: Not on file    Relationship status: Not on file  Other Topics Concern  . Not on file  Social History Narrative  Melanie Rose is a pleasant female who is married and has 2 children, son age 67, daughter age 83.  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   Outpatient Encounter Medications as of 08/28/2018  Medication Sig  . levothyroxine (SYNTHROID, LEVOTHROID) 100 MCG tablet TAKE 1 TABLET BY MOUTH  EVERY DAY  . [DISCONTINUED] predniSONE (STERAPRED UNI-PAK 21 TAB) 10 MG (21) TBPK tablet As directed x 6 days   No facility-administered encounter medications on file as of 08/28/2018.    ALLERGIES: No Known Allergies VACCINATION STATUS: Immunization History  Administered Date(s) Administered  . Influenza,inj,Quad PF,6+ Mos 06/19/2018  . Influenza,trivalent, recombinat, inj, PF 07/03/2013  . Tdap 06/24/2011     HPI    Melanie Rose  is a patient with the above medical history. she was diagnosed  with hypothyroidism at approximate age of 80 years after being treated with radioactive iodine thyroid ablation for primary hyperthyroidism.  She was subsequently initiated on levothyroxine, took various doses over the years.  She is currently on levothyroxine 100 mcg p.o. nightly.   She has no new complaints.  She is moving to the area and wants to establish care.  I reviewed patient's  thyroid tests:  Lab Results  Component Value Date   TSH 3.530 02/06/2018   FREET4 1.36 02/06/2018     -She denies  recent weight gain, cold intolerance.  She denies any history of goiter, dysphagia, odynophagia.     she denies family history of  thyroid disorders, or any family history of thyroid malignancy. She is not taking any other medications at this time. ROS:  Constitutional: no weight gain/loss, no fatigue, no subjective hyperthermia, no subjective hypothermia Eyes: no blurry vision, no xerophthalmia ENT: no sore throat, no nodules palpated in throat, no dysphagia/odynophagia, no hoarseness Cardiovascular: no Chest Pain, no Shortness of Breath, no palpitations, no leg swelling Respiratory: no cough, no SOB Gastrointestinal: no Nausea/Vomiting/Diarhhea Musculoskeletal: no muscle/joint aches Skin: no rashes Neurological: no tremors, no numbness, no tingling, no dizziness Psychiatric: no depression, no anxiety   Physical Exam: BP 104/73 (BP Location: Left Arm, Patient Position: Sitting, Cuff Size: Large)   Pulse 77   Resp 12   Ht 5' (1.524 m)   Wt 171 lb 3.2 oz (77.7 kg)   SpO2 100% Comment: room air  BMI 33.44 kg/m  Wt Readings from Last 3 Encounters:  08/28/18 171 lb 3.2 oz (77.7 kg)  07/24/18 172 lb (78 kg)  03/20/18 180 lb (81.6 kg)    Constitutional:  + Obese for height, not in acute distress, normal state of mind Eyes: PERRLA, EOMI, no exophthalmos ENT: moist mucous membranes, no thyromegaly, no cervical lymphadenopathy Cardiovascular: normal precordial activity, Regular Rate and Rhythm, no Murmur/Rubs/Gallops Respiratory:  adequate breathing efforts, no gross chest deformity, Clear to auscultation bilaterally Gastrointestinal: abdomen soft, Non -tender, No distension, Bowel Sounds present Musculoskeletal: no gross deformities, strength intact in all four extremities Skin: moist, warm, no rashes Neurological: no tremor with outstretched hands, Deep tendon reflexes normal in all four extremities.   CMP ( most recent) CMP     Component Value Date/Time   NA 138 03/03/2018  1015   NA 140 02/06/2018 0947   K 4.0 03/03/2018 1015   CL 105 03/03/2018 1015   CO2 25 03/03/2018 1015   GLUCOSE 84 03/03/2018 1015   BUN 6 03/03/2018 1015   BUN 7 02/06/2018 0947   CREATININE 0.97 03/03/2018 1015   CALCIUM 9.3 03/03/2018 1015  PROT 7.0 02/06/2018 0947   ALBUMIN 4.5 02/06/2018 0947   AST 17 02/06/2018 0947   ALT 20 02/06/2018 0947   ALKPHOS 70 02/06/2018 0947   BILITOT 0.3 02/06/2018 0947   GFRNONAA >60 03/03/2018 1015   GFRAA >60 03/03/2018 1015     Diabetic Labs (most recent): No results found for: HGBA1C   Lipid Panel ( most recent) Lipid Panel  No results found for: CHOL, TRIG, HDL, CHOLHDL, VLDL, LDLCALC, LDLDIRECT     Lab Results  Component Value Date   TSH 3.530 02/06/2018   FREET4 1.36 02/06/2018       ASSESSMENT: 1. Hypothyroidism RAI induced  PLAN:    Patient with long-standing RAI induced hypothyroidism, on levothyroxine therapy. On physical exam , patient  does not  have  gross goiter, thyroid nodules, or neck compression symptoms. -She will have repeat full profile thyroid function tests before making adjustments in her levothyroxine dose. -I kept her on the same dose of levothyroxine 100 mcg p.o. daily before breakfast.   - We discussed about correct intake of levothyroxine, at fasting, with water, separated by at least 30 minutes from breakfast, and separated by more than 4 hours from calcium, iron, multivitamins, acid reflux medications (PPIs). -Patient is made aware of the fact that thyroid hormone replacement is needed for life, dose to be adjusted by periodic monitoring of thyroid function tests.  -Due to absence of clinical goiter, no need for thyroid ultrasound.  She will return in 3 weeks to discuss her results and treatment adjustment if necessary.  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    [name] participated in the discussions, expressed understanding, and voiced agreement with the above plans.  All questions were answered to her satisfaction. she is encouraged to contact clinic should she have any questions or concerns prior to her return visit.  Return in about 3 weeks (around 09/18/2018) for Follow up with Pre-visit Labs.  Glade Lloyd, MD [facility] [address] [place],  [address] Phone: [phone]  Fax: [phone]   08/28/2018, 1:23 PM  This note was partially dictated with voice recognition software. Similar sounding words can be transcribed inadequately or may not  be corrected upon review.

## 2018-08-29 LAB — THYROGLOBULIN ANTIBODY

## 2018-08-29 LAB — TSH: TSH: 3.76 u[IU]/mL (ref 0.450–4.500)

## 2018-08-29 LAB — THYROID PEROXIDASE ANTIBODY: THYROID PEROXIDASE ANTIBODY: 8 [IU]/mL (ref 0–34)

## 2018-08-29 LAB — T4, FREE: Free T4: 1.34 ng/dL (ref 0.82–1.77)

## 2018-08-31 DIAGNOSIS — M9904 Segmental and somatic dysfunction of sacral region: Secondary | ICD-10-CM | POA: Diagnosis not present

## 2018-08-31 DIAGNOSIS — M9903 Segmental and somatic dysfunction of lumbar region: Secondary | ICD-10-CM | POA: Diagnosis not present

## 2018-08-31 DIAGNOSIS — M9902 Segmental and somatic dysfunction of thoracic region: Secondary | ICD-10-CM | POA: Diagnosis not present

## 2018-08-31 DIAGNOSIS — M62830 Muscle spasm of back: Secondary | ICD-10-CM | POA: Diagnosis not present

## 2018-09-05 DIAGNOSIS — M9902 Segmental and somatic dysfunction of thoracic region: Secondary | ICD-10-CM | POA: Diagnosis not present

## 2018-09-05 DIAGNOSIS — M9904 Segmental and somatic dysfunction of sacral region: Secondary | ICD-10-CM | POA: Diagnosis not present

## 2018-09-05 DIAGNOSIS — M9903 Segmental and somatic dysfunction of lumbar region: Secondary | ICD-10-CM | POA: Diagnosis not present

## 2018-09-05 DIAGNOSIS — M62830 Muscle spasm of back: Secondary | ICD-10-CM | POA: Diagnosis not present

## 2018-09-07 DIAGNOSIS — M9903 Segmental and somatic dysfunction of lumbar region: Secondary | ICD-10-CM | POA: Diagnosis not present

## 2018-09-07 DIAGNOSIS — M9904 Segmental and somatic dysfunction of sacral region: Secondary | ICD-10-CM | POA: Diagnosis not present

## 2018-09-07 DIAGNOSIS — M9902 Segmental and somatic dysfunction of thoracic region: Secondary | ICD-10-CM | POA: Diagnosis not present

## 2018-09-07 DIAGNOSIS — M62830 Muscle spasm of back: Secondary | ICD-10-CM | POA: Diagnosis not present

## 2018-09-11 DIAGNOSIS — M9904 Segmental and somatic dysfunction of sacral region: Secondary | ICD-10-CM | POA: Diagnosis not present

## 2018-09-11 DIAGNOSIS — M9902 Segmental and somatic dysfunction of thoracic region: Secondary | ICD-10-CM | POA: Diagnosis not present

## 2018-09-11 DIAGNOSIS — M9903 Segmental and somatic dysfunction of lumbar region: Secondary | ICD-10-CM | POA: Diagnosis not present

## 2018-09-11 DIAGNOSIS — M62830 Muscle spasm of back: Secondary | ICD-10-CM | POA: Diagnosis not present

## 2018-09-14 DIAGNOSIS — M9903 Segmental and somatic dysfunction of lumbar region: Secondary | ICD-10-CM | POA: Diagnosis not present

## 2018-09-14 DIAGNOSIS — M9904 Segmental and somatic dysfunction of sacral region: Secondary | ICD-10-CM | POA: Diagnosis not present

## 2018-09-14 DIAGNOSIS — M62830 Muscle spasm of back: Secondary | ICD-10-CM | POA: Diagnosis not present

## 2018-09-14 DIAGNOSIS — M9902 Segmental and somatic dysfunction of thoracic region: Secondary | ICD-10-CM | POA: Diagnosis not present

## 2018-09-18 DIAGNOSIS — M9904 Segmental and somatic dysfunction of sacral region: Secondary | ICD-10-CM | POA: Diagnosis not present

## 2018-09-18 DIAGNOSIS — M9902 Segmental and somatic dysfunction of thoracic region: Secondary | ICD-10-CM | POA: Diagnosis not present

## 2018-09-18 DIAGNOSIS — M9903 Segmental and somatic dysfunction of lumbar region: Secondary | ICD-10-CM | POA: Diagnosis not present

## 2018-09-18 DIAGNOSIS — M62830 Muscle spasm of back: Secondary | ICD-10-CM | POA: Diagnosis not present

## 2018-09-19 ENCOUNTER — Ambulatory Visit (INDEPENDENT_AMBULATORY_CARE_PROVIDER_SITE_OTHER): Payer: 59 | Admitting: "Endocrinology

## 2018-09-19 ENCOUNTER — Encounter: Payer: Self-pay | Admitting: "Endocrinology

## 2018-09-19 VITALS — BP 99/69 | HR 80 | Ht 60.0 in | Wt 169.0 lb

## 2018-09-19 DIAGNOSIS — E890 Postprocedural hypothyroidism: Secondary | ICD-10-CM

## 2018-09-19 MED ORDER — LEVOTHYROXINE SODIUM 112 MCG PO TABS: 112.0000 ug | ORAL_TABLET | Freq: Every day | ORAL | 3 refills | Status: DC

## 2018-09-19 NOTE — Progress Notes (Signed)
Endocrinology follow Note                                         09/19/2018, 1:10 PM   Melanie Rose is a 38 y.o.-year-old female patient being seen in follow-up for hypothyroidism referred by Janora Norlander, DO.   Past Medical History:  Diagnosis Date  . Abnormal Pap smear   . Breast mass in female    Left  . Chronic kidney disease    kidney stones  . Constipation   . Fibromyalgia   . Generalized headaches   . GERD (gastroesophageal reflux disease)   . Hemorrhoids   . History of kidney stones   . Hypothyroidism    sp radioactive  iodine therapy for hyperthyroidism   . Vaginal Pap smear, abnormal    Past Surgical History:  Procedure Laterality Date  . BREAST LUMPECTOMY WITH RADIOACTIVE SEED LOCALIZATION Left 03/07/2018   Procedure: LEFT BREAST LUMPECTOMY WITH RADIOACTIVE SEED LOCALIZATION;  Surgeon: Erroll Luna, MD;  Location: [facility];  Service: General;  Laterality: Left;  . LAPAROSCOPIC TUBAL LIGATION Bilateral 01/16/2015   Procedure: LAPAROSCOPIC TUBAL LIGATION;  Surgeon: Newton Pigg, MD;  Location: [facility];  Service: Gynecology;  Laterality: Bilateral;  . TUBAL LIGATION     Social History   Socioeconomic History  . Marital status: Married    Spouse name: Not on file  . Number of children: Not on file  . Years of education: Not on file  . Highest education level: Not on file  Occupational History  . Not on file  Social Needs  . Financial resource strain: Not on file  . Food insecurity:    Worry: Not on file    Inability: Not on file  . Transportation needs:    Medical: Not on file    Non-medical: Not on file  Tobacco Use  . Smoking status: Former Smoker    Packs/day: 1.00    Years: 10.00    Pack years: 10.00    Types: Cigarettes    Last attempt to quit: 06/22/2009    Years since quitting: 9.2  . Smokeless tobacco: Never Used  Substance and Sexual Activity  . Alcohol use:  No  . Drug use: No  . Sexual activity: Yes    Birth control/protection: Surgical    Comment: tubal  Lifestyle  . Physical activity:    Days per week: Not on file    Minutes per session: Not on file  . Stress: Not on file  Relationships  . Social connections:    Talks on phone: Not on file    Gets together: Not on file    Attends religious service: Not on file    Active member of club or organization: Not on file    Attends meetings of clubs or organizations: Not on file    Relationship status: Not on file  Other Topics Concern  . Not on file  Social History Narrative  Melanie Rose is a pleasant female who is married and has 2 children, son age 58, daughter age 13.  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   Outpatient Encounter Medications as of 09/19/2018  Medication Sig  . levothyroxine (SYNTHROID, LEVOTHROID) 112 MCG tablet Take 1 tablet (112 mcg total) by mouth daily before breakfast.  . [DISCONTINUED] levothyroxine (SYNTHROID, LEVOTHROID) 100 MCG tablet TAKE 1 TABLET BY MOUTH  EVERY DAY   No facility-administered encounter medications on file as of 09/19/2018.    ALLERGIES: No Known Allergies VACCINATION STATUS: Immunization History  Administered Date(s) Administered  . Influenza,inj,Quad PF,6+ Mos 06/19/2018  . Influenza,trivalent, recombinat, inj, PF 07/03/2013  . Tdap 06/24/2011     HPI    Melanie Rose  is a patient with the above medical history. she was diagnosed  with hypothyroidism at approximate age of 14 years after being treated with radioactive iodine thyroid ablation for primary hyperthyroidism.  She was subsequently initiated on levothyroxine, took various doses over the years.  She is currently on levothyroxine 100 mcg p.o. nightly.  She is returning with repeat thyroid function test.  She has no new complaints today.    -She denies recent weight gain, cold intolerance.  She denies any history of goiter, dysphagia, odynophagia.      she denies family history of  thyroid disorders, or any family history of thyroid malignancy. She is not taking any other medications at this time.  ROS:  Constitutional: no weight gain/loss, no fatigue, no subjective hyperthermia, no subjective hypothermia Eyes: no blurry vision, no xerophthalmia ENT: no sore throat, no nodules palpated in throat, no dysphagia/odynophagia, no hoarseness Cardiovascular: no Chest Pain, no Shortness of Breath, no palpitations, no leg swelling  Musculoskeletal: no muscle/joint aches Skin: no rashes Neurological: no tremors, no numbness, no tingling, no dizziness Psychiatric: no depression, no anxiety   Physical Exam: BP 99/69   Pulse 80   Ht 5' (1.524 m)   Wt 169 lb (76.7 kg)   BMI 33.01 kg/m  Wt Readings from Last 3 Encounters:  09/19/18 169 lb (76.7 kg)  08/28/18 171 lb 3.2 oz (77.7 kg)  07/24/18 172 lb (78 kg)    Constitutional:  + Obese for height, not in acute distress, normal state of mind Eyes: PERRLA, EOMI, no exophthalmos ENT: moist mucous membranes, no thyromegaly, no cervical lymphadenopathy Musculoskeletal: no gross deformities, strength intact in all four extremities Skin: moist, warm, no rashes Neurological: no tremor with outstretched hands, Deep tendon reflexes normal in all four extremities.   CMP ( most recent) CMP     Component Value Date/Time   NA 138 03/03/2018 1015   NA 140 02/06/2018 0947   K 4.0 03/03/2018 1015   CL 105 03/03/2018 1015   CO2 25 03/03/2018 1015   GLUCOSE 84 03/03/2018 1015   BUN 6 03/03/2018 1015   BUN 7 02/06/2018 0947   CREATININE 0.97 03/03/2018 1015   CALCIUM 9.3 03/03/2018 1015   PROT 7.0 02/06/2018 0947   ALBUMIN 4.5 02/06/2018 0947   AST 17 02/06/2018 0947   ALT 20 02/06/2018 0947   ALKPHOS 70 02/06/2018 0947   BILITOT 0.3 02/06/2018 0947   GFRNONAA >60 03/03/2018 1015   GFRAA >60 03/03/2018 1015    Lab Results  Component Value Date   TSH 3.760 08/28/2018   TSH 3.530  02/06/2018   FREET4 1.34 08/28/2018   FREET4 1.36 02/06/2018       ASSESSMENT: 1. Hypothyroidism RAI induced  PLAN:  -Her previsit thyroid function tests are consistent with inadequate replacement.  She would benefit from slight increase in her levothyroxine dose.  I discussed and increased her levothyroxine to 112 mcg p.o. daily before breakfast.   - We discussed about correct intake of levothyroxine, at fasting, with water, separated by at least 30 minutes from breakfast, and separated by more than 4 hours from calcium, iron, multivitamins, acid reflux medications (PPIs). -Patient is made aware of the fact that thyroid hormone replacement is needed for life, dose to be adjusted by periodic monitoring of thyroid function tests. -Due to absence of clinical goiter, no need for thyroid ultrasound.  Return in about 4 months (around 01/18/2019) for Follow up with Pre-visit Labs.  Glade Lloyd, MD [facility] [address] [place], [place] [address] Phone: [phone]  Fax: [phone]   09/19/2018, 1:10 PM  This note was partially dictated with voice recognition software. Similar sounding words can be transcribed inadequately or may not  be corrected upon review.

## 2018-09-25 DIAGNOSIS — M62830 Muscle spasm of back: Secondary | ICD-10-CM | POA: Diagnosis not present

## 2018-09-25 DIAGNOSIS — M9903 Segmental and somatic dysfunction of lumbar region: Secondary | ICD-10-CM | POA: Diagnosis not present

## 2018-09-25 DIAGNOSIS — M9904 Segmental and somatic dysfunction of sacral region: Secondary | ICD-10-CM | POA: Diagnosis not present

## 2018-09-25 DIAGNOSIS — M9902 Segmental and somatic dysfunction of thoracic region: Secondary | ICD-10-CM | POA: Diagnosis not present

## 2018-10-02 DIAGNOSIS — M9904 Segmental and somatic dysfunction of sacral region: Secondary | ICD-10-CM | POA: Diagnosis not present

## 2018-10-02 DIAGNOSIS — M9903 Segmental and somatic dysfunction of lumbar region: Secondary | ICD-10-CM | POA: Diagnosis not present

## 2018-10-02 DIAGNOSIS — M9902 Segmental and somatic dysfunction of thoracic region: Secondary | ICD-10-CM | POA: Diagnosis not present

## 2018-10-02 DIAGNOSIS — M62830 Muscle spasm of back: Secondary | ICD-10-CM | POA: Diagnosis not present

## 2018-10-16 DIAGNOSIS — M9903 Segmental and somatic dysfunction of lumbar region: Secondary | ICD-10-CM | POA: Diagnosis not present

## 2018-10-16 DIAGNOSIS — M9902 Segmental and somatic dysfunction of thoracic region: Secondary | ICD-10-CM | POA: Diagnosis not present

## 2018-10-16 DIAGNOSIS — M9904 Segmental and somatic dysfunction of sacral region: Secondary | ICD-10-CM | POA: Diagnosis not present

## 2018-10-16 DIAGNOSIS — M62830 Muscle spasm of back: Secondary | ICD-10-CM | POA: Diagnosis not present

## 2018-10-30 DIAGNOSIS — M9903 Segmental and somatic dysfunction of lumbar region: Secondary | ICD-10-CM | POA: Diagnosis not present

## 2018-10-30 DIAGNOSIS — M62830 Muscle spasm of back: Secondary | ICD-10-CM | POA: Diagnosis not present

## 2018-10-30 DIAGNOSIS — M9902 Segmental and somatic dysfunction of thoracic region: Secondary | ICD-10-CM | POA: Diagnosis not present

## 2018-10-30 DIAGNOSIS — M9904 Segmental and somatic dysfunction of sacral region: Secondary | ICD-10-CM | POA: Diagnosis not present

## 2018-12-21 ENCOUNTER — Telehealth: Payer: Self-pay

## 2018-12-21 DIAGNOSIS — E890 Postprocedural hypothyroidism: Secondary | ICD-10-CM

## 2018-12-21 MED ORDER — LEVOTHYROXINE SODIUM 112 MCG PO TABS: 112.0000 ug | ORAL_TABLET | Freq: Every day | ORAL | 3 refills | Status: DC

## 2018-12-21 NOTE — Telephone Encounter (Signed)
Melanie Rose, CMA  

## 2018-12-22 ENCOUNTER — Encounter: Payer: Self-pay | Admitting: Family Medicine

## 2019-01-18 ENCOUNTER — Ambulatory Visit: Payer: 59 | Admitting: "Endocrinology

## 2019-02-13 ENCOUNTER — Other Ambulatory Visit: Payer: Self-pay | Admitting: "Endocrinology

## 2019-02-13 DIAGNOSIS — E890 Postprocedural hypothyroidism: Secondary | ICD-10-CM

## 2019-05-19 ENCOUNTER — Other Ambulatory Visit: Payer: Self-pay | Admitting: "Endocrinology

## 2019-05-19 DIAGNOSIS — E890 Postprocedural hypothyroidism: Secondary | ICD-10-CM

## 2019-05-29 ENCOUNTER — Other Ambulatory Visit: Payer: Self-pay

## 2019-05-30 ENCOUNTER — Ambulatory Visit (INDEPENDENT_AMBULATORY_CARE_PROVIDER_SITE_OTHER): Payer: BC Managed Care – PPO | Admitting: Family Medicine

## 2019-05-30 ENCOUNTER — Encounter: Payer: Self-pay | Admitting: Family Medicine

## 2019-05-30 VITALS — BP 98/68 | HR 90 | Temp 98.7°F | Ht 60.0 in | Wt 176.0 lb

## 2019-05-30 DIAGNOSIS — Z23 Encounter for immunization: Secondary | ICD-10-CM | POA: Diagnosis not present

## 2019-05-30 DIAGNOSIS — E669 Obesity, unspecified: Secondary | ICD-10-CM | POA: Diagnosis not present

## 2019-05-30 DIAGNOSIS — E890 Postprocedural hypothyroidism: Secondary | ICD-10-CM

## 2019-05-30 DIAGNOSIS — E559 Vitamin D deficiency, unspecified: Secondary | ICD-10-CM | POA: Diagnosis not present

## 2019-05-30 NOTE — Patient Instructions (Signed)
Let's plan for fasting labs at next visit so we can check cholesterol.  You had labs performed today.  You will be contacted with the results of the labs once they are available, usually in the next 3 business days for routine lab work.  If you have an active my chart account, they will be released to your MyChart.  If you prefer to have these labs released to you via telephone, please let us know.  If you had a pap smear or biopsy performed, expect to be contacted in about 7-10 days.

## 2019-05-30 NOTE — Progress Notes (Signed)
Subjective: CC: Hypothyroidism PCP: Melanie Norlander, DO [id number] Melanie Rose is a 38 y.o. female presenting to clinic today for:  1.  Hypothyroidism/obesity History: Radioablation of thyroid. Patient reports compliance with Synthroid 112 mcg daily.  Denies any voice changes, difficulty swelling, tremor, heart palpitations, change in bowel habits.  She has gained a little bit of weight but attributes this to eating ice cream regularly.  She is physically active with yoga which has improved her back pain as well.  She was seeing a chiropractor here in [place] which also helped quite a bit.  No structured aerobic exercise.  ROS: Per HPI  No Known Allergies Past Medical History:  Diagnosis Date  . Abnormal Pap smear   . Breast mass in female    Left  . Chronic kidney disease    kidney stones  . Constipation   . Fibromyalgia   . Generalized headaches   . GERD (gastroesophageal reflux disease)   . Hemorrhoids   . History of kidney stones   . Hypothyroidism    sp radioactive  iodine therapy for hyperthyroidism   . 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  . Vaginal Pap smear, abnormal     Current Outpatient Medications:  .  levothyroxine (SYNTHROID) 112 MCG tablet, TAKE 1 TABLET (112 MCG TOTAL) BY MOUTH DAILY BEFORE BREAKFAST., Disp: 30 tablet, Rfl: 3 Social History   Socioeconomic History  . Marital status: Married    Spouse name: Not on file  . Number of children: Not on file  . Years of education: Not on file  . Highest education level: Not on file  Occupational History  . Not on file  Social Needs  . Financial resource strain: Not on file  . Food insecurity    Worry: Not on file    Inability: Not on file  . Transportation needs    Medical: Not on file    Non-medical: Not on file  Tobacco Use  . Smoking status: Former Smoker    Packs/day: 1.00    Years: 10.00    Pack years: 10.00    Types: Cigarettes    Quit date: 06/22/2009    Years since  quitting: 9.9  . Smokeless tobacco: Never Used  Substance and Sexual Activity  . Alcohol use: No  . Drug use: No  . Sexual activity: Yes    Birth control/protection: Surgical    Comment: tubal  Lifestyle  . Physical activity    Days per week: Not on file    Minutes per session: Not on file  . Stress: Not on file  Relationships  . Social Herbalist on phone: Not on file    Gets together: Not on file    Attends religious service: Not on file    Active member of club or organization: Not on file    Attends meetings of clubs or organizations: Not on file    Relationship status: Not on file  . Intimate partner violence    Fear of current or ex partner: Not on file    Emotionally abused: Not on file    Physically abused: Not on file    Forced sexual activity: Not on file  Other Topics Concern  . Not on file  Social History Narrative   [name] is a pleasant female who is married and has 2 children, son age 56, daughter age 61.  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   Family History  Problem  Relation Age of Onset  . Hypertension Mother   . Hypertension Father   . Heart disease Maternal Grandfather   . Cancer Paternal Grandfather     Objective: Office vital signs reviewed. BP 98/68   Pulse 90   Temp 98.7 F (37.1 Melanie) (Temporal)   Ht 5' (1.524 m)   Wt 176 lb (79.8 kg)   LMP 05/14/2019 (Exact Date)   SpO2 97%   BMI 34.37 kg/m   Physical Examination:  General: Awake, alert, well nourished, No acute distress HEENT: Normal; no palpable thyroid nodules or masses.  No exophthalmos Cardio: regular rate and rhythm, S1S2 heard, no murmurs appreciated Pulm: clear to auscultation bilaterally, no wheezes, rhonchi or rales; normal work of breathing on room air Extremities: warm, well perfused, No edema, cyanosis or clubbing; + 2pulses bilaterally MSK: Normal gait and station Skin: dry; intact; no rashes or lesions; normal temperature Neuro: No  resting tremor  Assessment/ Plan: 38 y.o. female   1. Postablative hypothyroidism Essentially asymptomatic.  Check thyroid panel.  Will refill Synthroid pending results - Thyroid Panel With TSH  2. Vitamin D deficiency - VITAMIN D 25 Hydroxy (Vit-D Deficiency, Fractures)  3. Obesity (BMI 30-39.9) Counseled on aerobic exercise, reduction in carbohydrate intake.  Briefly considered pharmaceutical intervention.  Will discuss further if she is not seeing results with lifestyle modification   Orders Placed This Encounter  Procedures  . Thyroid Panel With TSH  . VITAMIN D 25 Hydroxy (Vit-D Deficiency, Fractures)   No orders of the defined types were placed in this encounter.    Melanie Norlander, DO [facility] [phone]

## 2019-05-31 LAB — THYROID PANEL WITH TSH
Free Thyroxine Index: 2.4 (ref 1.2–4.9)
T3 Uptake Ratio: 25 % (ref 24–39)
T4, Total: 9.5 ug/dL (ref 4.5–12.0)
TSH: 2.57 u[IU]/mL (ref 0.450–4.500)

## 2019-05-31 LAB — VITAMIN D 25 HYDROXY (VIT D DEFICIENCY, FRACTURES): Vit D, 25-Hydroxy: 19.9 ng/mL — ABNORMAL LOW (ref 30.0–100.0)

## 2019-06-01 ENCOUNTER — Other Ambulatory Visit: Payer: Self-pay | Admitting: Family Medicine

## 2019-06-01 DIAGNOSIS — E559 Vitamin D deficiency, unspecified: Secondary | ICD-10-CM

## 2019-06-01 MED ORDER — CHOLECALCIFEROL 1.25 MG (50000 UT) PO CAPS: 50000.0000 [IU] | ORAL_CAPSULE | ORAL | 0 refills | Status: AC

## 2019-06-07 HISTORY — PX: BREAST BIOPSY: SHX20

## 2019-07-03 ENCOUNTER — Encounter: Payer: Self-pay | Admitting: Family Medicine

## 2019-07-03 DIAGNOSIS — E890 Postprocedural hypothyroidism: Secondary | ICD-10-CM

## 2019-07-04 MED ORDER — LEVOTHYROXINE SODIUM 112 MCG PO TABS: 112.0000 ug | ORAL_TABLET | Freq: Every day | ORAL | 3 refills | Status: DC

## 2019-08-22 ENCOUNTER — Other Ambulatory Visit: Payer: Self-pay | Admitting: Family Medicine

## 2019-08-22 DIAGNOSIS — E559 Vitamin D deficiency, unspecified: Secondary | ICD-10-CM

## 2019-09-03 DIAGNOSIS — E559 Vitamin D deficiency, unspecified: Secondary | ICD-10-CM | POA: Diagnosis not present

## 2019-09-03 DIAGNOSIS — E890 Postprocedural hypothyroidism: Secondary | ICD-10-CM | POA: Diagnosis not present

## 2019-10-20 ENCOUNTER — Telehealth: Payer: Self-pay | Admitting: Nurse Practitioner

## 2019-10-20 DIAGNOSIS — R430 Anosmia: Secondary | ICD-10-CM

## 2019-10-20 DIAGNOSIS — R432 Parageusia: Secondary | ICD-10-CM

## 2019-10-20 DIAGNOSIS — Z20822 Contact with and (suspected) exposure to covid-19: Secondary | ICD-10-CM

## 2019-10-20 NOTE — Progress Notes (Signed)
E-Visit for Corona Virus Screening  Your current symptoms could be consistent with the coronavirus.  Many health care providers can now test patients at their office but not all are.  [place] has multiple testing sites. For information on our [place] testing locations and hours go to [email or web address]  We are enrolling you in our [place] for [place] . Daily you will receive a questionnaire within the [place] website. Our COVID 19 response team will be monitoring your responses daily.  Testing Information: The COVID-19 Community Testing sites will begin testing BY APPOINTMENT ONLY.  You can schedule online at [email or web address]  If you do not have access to a smart phone or computer you may call [phone] for an appointment.   Additional testing sites in the Community:  . For [facility] Testing sites in [facility]  FaceUpdate.uy  . For Pop-up testing sites in [place]  BowlDirectory.co.uk  . For Testing sites with regular hours [email or web address]  . For [place] [email or web address]  . For [facility] [email or web address]  . For [facility] testing in [place] and [employer] [email or web address]  . For [facility] testing in [facility]   [email or web address]  For  more information about community testing call [phone]   Please quarantine yourself while awaiting your test results. Please stay home for a minimum of 10 days from the first day of illness with improving symptoms and you have had 24 hours of no fever (without the use of Tylenol (Acetaminophen)  Motrin (Ibuprofen) or any fever reducing medication).  Also - Do not get tested prior to returning to work because once you have had a positive test the test can stay positive for more then a month in some cases.   You should wear a mask or cloth face covering over your nose and mouth if you must be around other people or animals, including pets (even at home). Try to stay at least 6 feet away from other people. This will protect the people around you.  Please continue good preventive care measures, including:  frequent hand-washing, avoid touching your face, cover coughs/sneezes, stay out of crowds and keep a 6 foot distance from others.  COVID-19 is a respiratory illness with symptoms that are similar to the flu. Symptoms are typically mild to moderate, but there have been cases of severe illness and death due to the virus.   The following symptoms may appear 2-14 days after exposure: . Fever . Cough . Shortness of breath or difficulty breathing . Chills . Repeated shaking with chills . Muscle pain . Headache . Sore throat . New loss of taste or smell . Fatigue . Congestion or runny nose . Nausea or vomiting . Diarrhea  Go to the nearest hospital ED for assessment if fever/cough/breathlessness are severe or illness seems like a threat to life.  It is vitally important that if you feel that you have an infection such as this virus or any other virus that you stay home and away from places where you may spread it to others.  You should avoid contact with people age 53 and older. Yes, everyone in your home should quarantine until your test results are back.  You can use medication such as delsym if develop cough.  You may also take acetaminophen (Tylenol) as needed for fever.  Reduce your risk of any infection by using the same precautions used for avoiding the common cold or flu:  Marland Kitchen Wash your hands often with soap and warm  water for at least 20 seconds.  If soap and water are not readily  available, use an alcohol-based hand sanitizer with at least 60% alcohol.  . If coughing or sneezing, cover your mouth and nose by coughing or sneezing into the elbow areas of your shirt or coat, into a tissue or into your sleeve (not your hands). . Avoid shaking hands with others and consider head nods or verbal greetings only. . Avoid touching your eyes, nose, or mouth with unwashed hands.  . Avoid close contact with people who are sick. . Avoid places or events with large numbers of people in one location, like concerts or sporting events. . Carefully consider travel plans you have or are making. . If you are planning any travel outside or inside the [place], visit the CDC's Travelers' Health webpage for the latest health notices. . If you have some symptoms but not all symptoms, continue to monitor at home and seek medical attention if your symptoms worsen. . If you are having a medical emergency, call 911.  HOME CARE . Only take medications as instructed by your medical team. . Drink plenty of fluids and get plenty of rest. . A steam or ultrasonic humidifier can help if you have congestion.   GET HELP RIGHT AWAY IF YOU HAVE EMERGENCY WARNING SIGNS** FOR COVID-19. If you or someone is showing any of these signs seek emergency medical care immediately. Call 911 or proceed to your closest emergency facility if: . You develop worsening high fever. . Trouble breathing . Bluish lips or face . Persistent pain or pressure in the chest . New confusion . Inability to wake or stay awake . You cough up blood. . Your symptoms become more severe  **This list is not all possible symptoms. Contact your medical provider for any symptoms that are sever or concerning to you.  MAKE SURE YOU   Understand these instructions.  Will watch your condition.  Will get help right away if you are not doing well or get worse.  Your e-visit answers were reviewed by a board certified advanced clinical  practitioner to complete your personal care plan.  Depending on the condition, your plan could have included both over the counter or prescription medications.  If there is a problem please reply once you have received a response from your provider.  Your safety is important to [place].  If you have drug allergies check your prescription carefully.    You can use MyChart to ask questions about today's visit, request a non-urgent call back, or ask for a work or school excuse for 24 hours related to this e-Visit. If it has been greater than 24 hours you will need to follow up with your provider, or enter a new e-Visit to address those concerns. You will get an e-mail in the next two days asking about your experience.  I hope that your e-visit has been valuable and will speed your recovery. Thank you for using e-visits.   5-10 minutes spent reviewing and documenting in chart.

## 2019-11-29 ENCOUNTER — Ambulatory Visit: Payer: Self-pay | Attending: Internal Medicine

## 2019-11-29 DIAGNOSIS — Z23 Encounter for immunization: Secondary | ICD-10-CM

## 2019-11-29 NOTE — Progress Notes (Signed)
   Covid-19 Vaccination Clinic  Name:  Melanie Rose    MRN: [id number] DOB: 06-Sep-1981  11/29/2019  Melanie Rose was observed post Covid-19 immunization for 15 minutes without incident. She was provided with Vaccine Information Sheet and instruction to access the V-Safe system.   Melanie Rose was instructed to call 911 with any severe reactions post vaccine: Marland Kitchen Difficulty breathing  . Swelling of face and throat  . A fast heartbeat  . A bad rash all over body  . Dizziness and weakness   Immunizations Administered    Name Date Dose VIS Date Route   Moderna COVID-19 Vaccine 11/29/2019 11:09 AM 0.5 mL 08/07/2019 Intramuscular   Manufacturer: Moderna   Lot: [id number]   [id number]

## 2020-01-02 ENCOUNTER — Ambulatory Visit: Payer: Self-pay | Attending: Internal Medicine

## 2020-01-02 DIAGNOSIS — Z23 Encounter for immunization: Secondary | ICD-10-CM

## 2020-01-02 NOTE — Progress Notes (Signed)
   Covid-19 Vaccination Clinic  Name:  Melanie Rose    MRN: [id number] DOB: May 08, 1981  01/02/2020  [name] was observed post Covid-19 immunization for 15 minutes without incident. She was provided with Vaccine Information Sheet and instruction to access the V-Safe system.   [name] was instructed to call 911 with any severe reactions post vaccine: Marland Kitchen Difficulty breathing  . Swelling of face and throat  . A fast heartbeat  . A bad rash all over body  . Dizziness and weakness   Immunizations Administered    Name Date Dose VIS Date Route   Moderna COVID-19 Vaccine 01/02/2020 10:54 AM 0.5 mL 08/2019 Intramuscular   Manufacturer: Moderna   Lot: [id number]   [id number]

## 2020-02-22 IMAGING — DX DG LUMBAR SPINE 2-3V
2 series · 2 of 2 positions shown · non-contrast
Comparison: None

CLINICAL DATA: Back pain

EXAM:
LUMBAR SPINE - 2-3 VIEW

[l-spine ap]
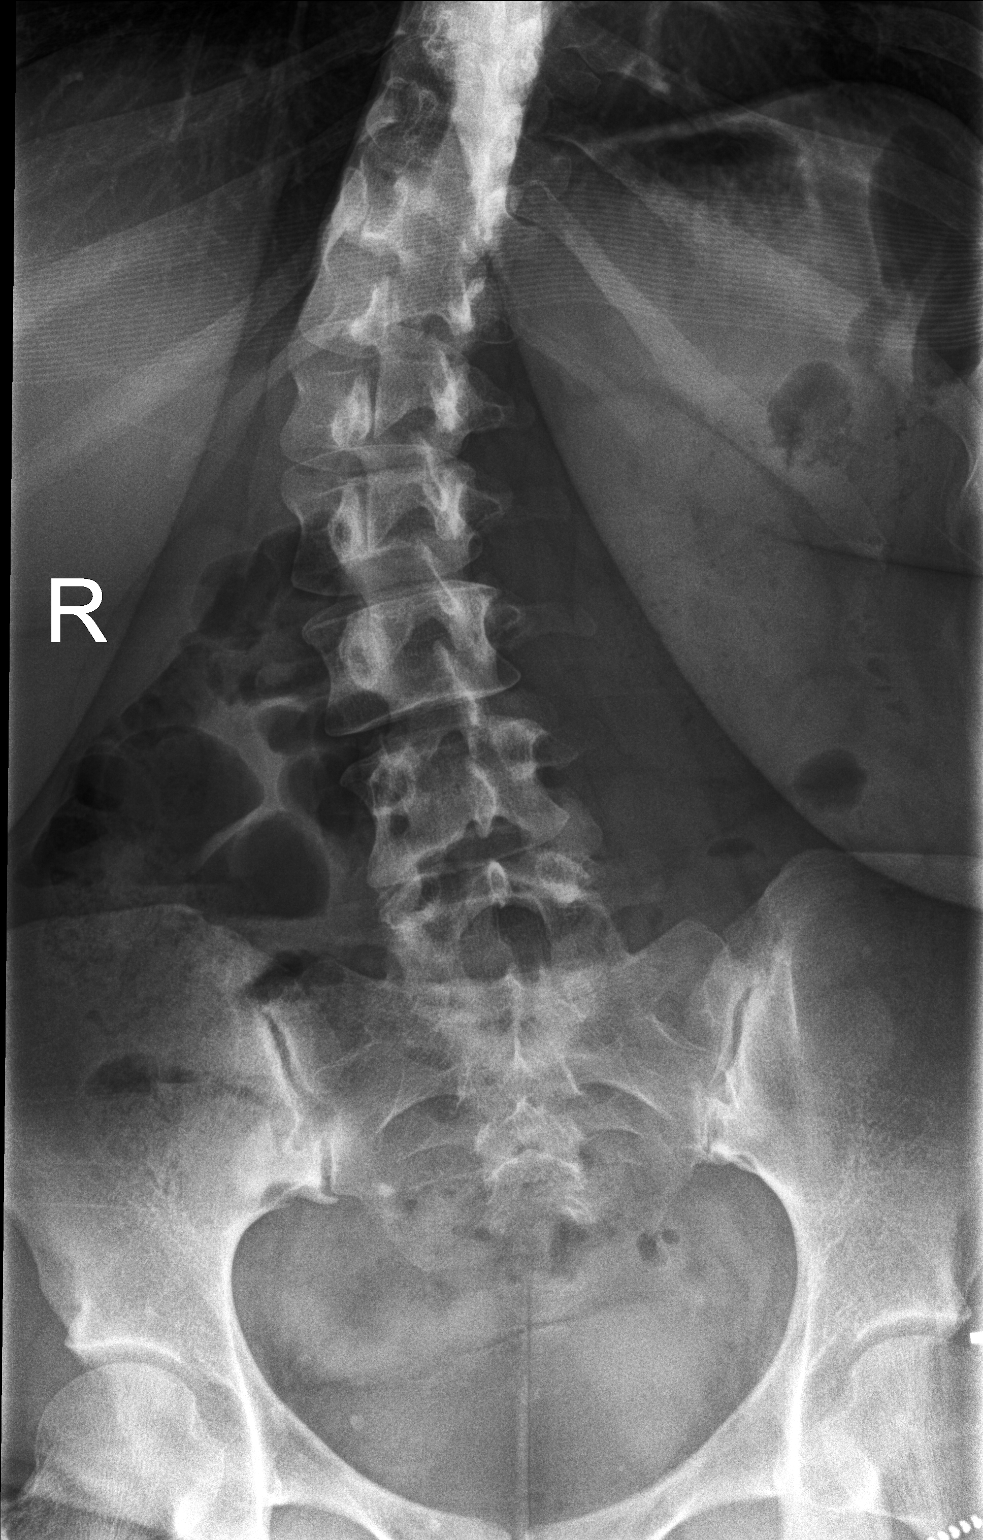

[l-spine lat]
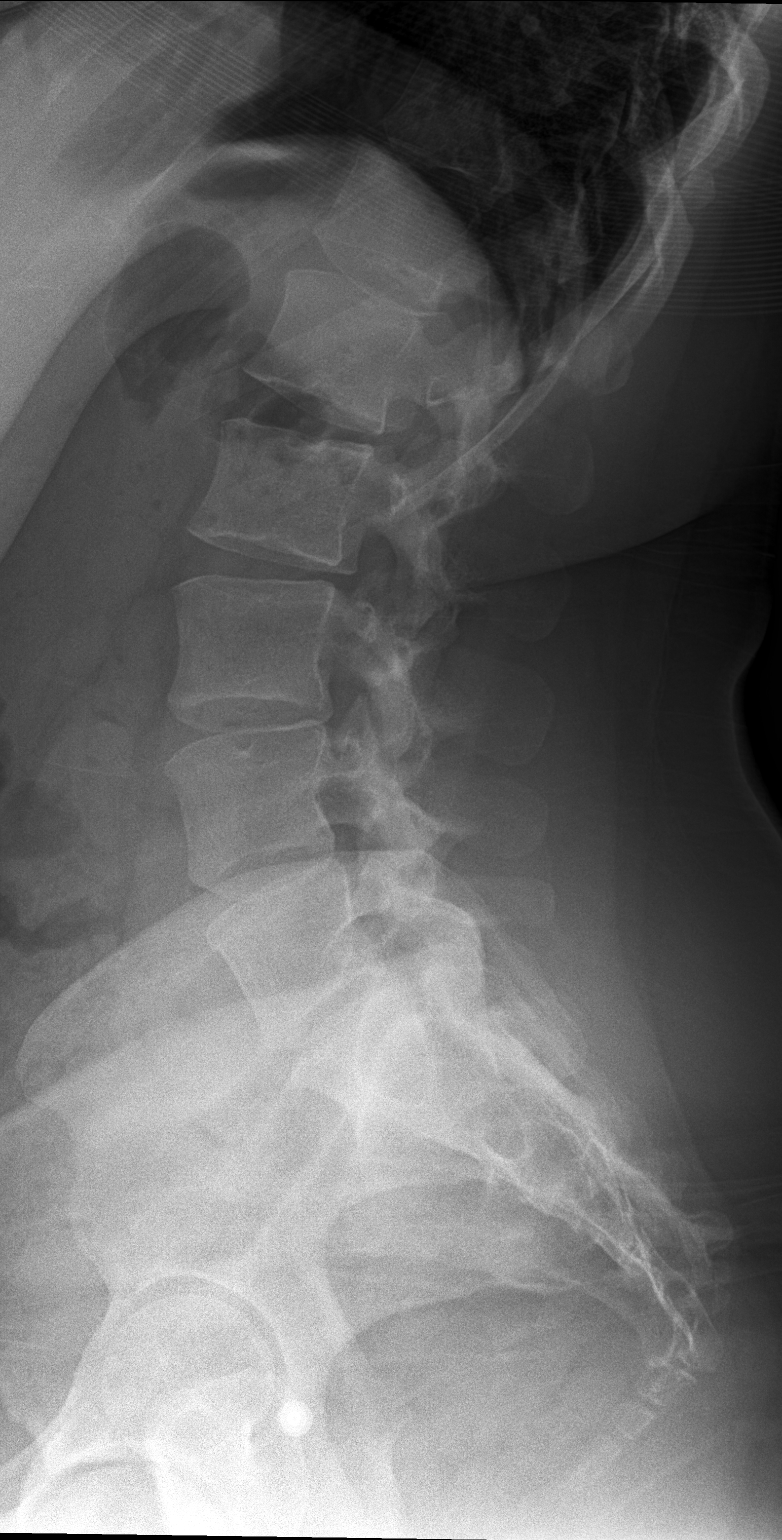

[2 of 2 positions shown; findings below may reference images not displayed]

FINDINGS: Osseous mineralization normal for technique.

5 non-rib-bearing lumbar vertebra.

Significant dextroconvex thoracolumbar scoliosis apex L1.

Vertebral body and disc space heights maintained.

No fracture, subluxation, or bone destruction.

Schmorl's node superior endplate L2.

SI joints preserved.
IMPRESSION: Dextroconvex thoracolumbar scoliosis.

No acute abnormalities.

## 2020-05-13 ENCOUNTER — Encounter: Payer: Self-pay | Admitting: Adult Health

## 2020-05-13 ENCOUNTER — Ambulatory Visit (INDEPENDENT_AMBULATORY_CARE_PROVIDER_SITE_OTHER): Payer: BC Managed Care – PPO | Admitting: Adult Health

## 2020-05-13 ENCOUNTER — Other Ambulatory Visit: Payer: Self-pay

## 2020-05-13 ENCOUNTER — Other Ambulatory Visit (HOSPITAL_COMMUNITY)
Admission: RE | Admit: 2020-05-13 | Discharge: 2020-05-13 | Disposition: A | Discharge status: Home / Self Care | Payer: BC Managed Care – PPO | Source: Ambulatory Visit | Attending: Adult Health | Admitting: Adult Health

## 2020-05-13 VITALS — BP 109/68 | HR 85 | Ht 60.5 in | Wt 182.0 lb

## 2020-05-13 DIAGNOSIS — N644 Mastodynia: Secondary | ICD-10-CM | POA: Insufficient documentation

## 2020-05-13 DIAGNOSIS — Z01419 Encounter for gynecological examination (general) (routine) without abnormal findings: Secondary | ICD-10-CM | POA: Diagnosis not present

## 2020-05-13 NOTE — Progress Notes (Signed)
Patient ID: Melanie Rose, female   DOB: 1981/09/05, 39 y.o.   MRN: [id number] History of Present Illness: Melanie Rose is a 39 year old white female,married, G2P2, in for well woman gyn exam and she requests pap this year. PCP is Dr Lajuana Ripple   Current Medications, Allergies, Past Medical History, Past Surgical History, Family History and Social History were reviewed in [place] record.     Review of Systems: Patient denies any headaches, hearing loss, fatigue, blurred vision, shortness of breath, chest pain, abdominal pain, problems with bowel movements, urination, or intercourse. No joint pain or mood swings. Has pain in left breast, where biopsy scar is, before period, then subsides Has spotting with sex before period too at times.   Physical Exam:BP 109/68 (BP Location: Left Arm, Patient Position: Sitting, Cuff Size: Normal)   Pulse 85   Ht 5' 0.5" (1.537 m)   Wt 182 lb (82.6 kg)   LMP 04/21/2020   BMI 34.96 kg/m  General:  Well developed, well nourished, no acute distress Skin:  Warm and dry Neck:  Midline trachea, normal thyroid, good ROM, no lymphadenopathy Lungs; Clear to auscultation bilaterally Breast:  No dominant palpable mass, retraction, or nipple discharge, is tender at scar left breast at 1-2 0' clock  Cardiovascular: Regular rate and rhythm Abdomen:  Soft, non tender, no hepatosplenomegaly Pelvic:  External genitalia is normal in appearance, no lesions.  The vagina is normal in appearance. Urethra has no lesions or masses. The cervix is bulbous.Pap with high risk HPV genotyping performed.  Uterus is felt to be normal size, shape, and contour.  No adnexal masses or tenderness noted.Bladder is non tender, no masses felt. Extremities/musculoskeletal:  No swelling or varicosities noted, no clubbing or cyanosis Psych:  No mood changes, alert and cooperative,seems happy AA is 0 Fall risk is low PHQ 2 score is 0  [facility] - 05/13/20 1339      Pregnancy  Intention Screening   Does the patient want to become pregnant in the next year? No    Does the patient's partner want to become pregnant in the next year? No    Would the patient like to discuss contraceptive options today? No      Contraception Wrap Up   Current Method Female Sterilization    End Method Female Sterilization    Contraception Counseling Provided No         Examination chaperoned by Celene Squibb LPN  Impression and Plan: 1. Encounter for gynecological examination with Papanicolaou smear of cervix Pap sent Physical in 1 year  Pap in 3 if normal Labs with PCP   2. Breast pain, left Will get diagnostic bilateral mammogram and [place] at [facility] 06/03/20 at 3 pm

## 2020-05-16 LAB — CYTOLOGY - PAP
Adequacy: ABSENT
Comment: NEGATIVE
Diagnosis: NEGATIVE
High risk HPV: NEGATIVE

## 2020-06-03 ENCOUNTER — Ambulatory Visit (HOSPITAL_COMMUNITY)
Admission: RE | Admit: 2020-06-03 | Discharge: 2020-06-03 | Disposition: A | Discharge status: Home / Self Care | Payer: BC Managed Care – PPO | Source: Ambulatory Visit | Attending: Adult Health | Admitting: Adult Health

## 2020-06-03 ENCOUNTER — Other Ambulatory Visit: Payer: Self-pay

## 2020-06-03 ENCOUNTER — Encounter (HOSPITAL_COMMUNITY): Payer: Self-pay

## 2020-06-03 DIAGNOSIS — N644 Mastodynia: Secondary | ICD-10-CM

## 2020-06-03 DIAGNOSIS — R928 Other abnormal and inconclusive findings on diagnostic imaging of breast: Secondary | ICD-10-CM | POA: Diagnosis not present

## 2020-06-03 DIAGNOSIS — N6312 Unspecified lump in the right breast, upper inner quadrant: Secondary | ICD-10-CM | POA: Diagnosis not present

## 2020-06-23 ENCOUNTER — Other Ambulatory Visit: Payer: Self-pay

## 2020-06-23 ENCOUNTER — Ambulatory Visit (INDEPENDENT_AMBULATORY_CARE_PROVIDER_SITE_OTHER): Payer: BC Managed Care – PPO | Admitting: Family Medicine

## 2020-06-23 ENCOUNTER — Encounter: Payer: Self-pay | Admitting: Family Medicine

## 2020-06-23 VITALS — BP 98/64 | HR 73 | Temp 97.3°F | Ht 60.0 in | Wt 181.0 lb

## 2020-06-23 DIAGNOSIS — M25562 Pain in left knee: Secondary | ICD-10-CM | POA: Diagnosis not present

## 2020-06-23 DIAGNOSIS — Z23 Encounter for immunization: Secondary | ICD-10-CM

## 2020-06-23 DIAGNOSIS — E890 Postprocedural hypothyroidism: Secondary | ICD-10-CM | POA: Diagnosis not present

## 2020-06-23 DIAGNOSIS — G8929 Other chronic pain: Secondary | ICD-10-CM | POA: Diagnosis not present

## 2020-06-23 DIAGNOSIS — E669 Obesity, unspecified: Secondary | ICD-10-CM

## 2020-06-23 MED ORDER — MELOXICAM 15 MG PO TABS: 7.5000 mg | ORAL_TABLET | Freq: Every day | ORAL | 1 refills | Status: DC | PRN

## 2020-06-23 NOTE — Progress Notes (Signed)
Subjective: CC: Hypothyroidism PCP: Melanie Norlander, DO ZOX:WRUE Melanie Rose is a 39 y.o. female presenting to clinic today for:  1.  Hypothyroidism/obesity History: Radioablation of thyroid. Patient reports compliance with her Synthroid.  She has follow-up with endocrinology in December.  No change in bowel habits, voice.  She has occasional heart palpitations but nothing that is sustained.  She continues to have difficulty with weight loss  2.  Knee pain Patient reports that she had a nail go through her left medial knee at 39 years old.  She has subsequently developed increasing irritation of the knee and pressure.  Usually the pressure is relieved by "popping it".  But she notes that lately the knee continues to have ongoing pressure.  She finds that yoga exacerbates the knee pain.  She avoids walking up or down hills or stairs because this exacerbates symptoms.  She does report symptoms of instability.  She also notes that pivoting seems to aggravate the knee   ROS: Per HPI  No Known Allergies Past Medical History:  Diagnosis Date  . Abnormal Pap smear   . Breast mass in female    Left  . Chronic kidney disease    kidney stones  . Constipation   . Fibromyalgia   . Generalized headaches   . GERD (gastroesophageal reflux disease)   . Hemorrhoids   . History of kidney stones   . Hypothyroidism    sp radioactive  iodine therapy for hyperthyroidism   . 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  . Vaginal Pap smear, abnormal     Current Outpatient Medications:  .  levothyroxine (SYNTHROID) 112 MCG tablet, Take 1 tablet (112 mcg total) by mouth daily before breakfast., Disp: 30 tablet, Rfl: 3 Social History   Socioeconomic History  . Marital status: Married    Spouse name: Not on file  . Number of children: Not on file  . Years of education: Not on file  . Highest education level: Not on file  Occupational History  . Not on file  Tobacco Use  . Smoking  status: Former Smoker    Packs/day: 1.00    Years: 10.00    Pack years: 10.00    Types: Cigarettes    Quit date: 06/22/2009    Years since quitting: 11.0  . Smokeless tobacco: Never Used  Vaping Use  . Vaping Use: Never used  Substance and Sexual Activity  . Alcohol use: No  . Drug use: No  . Sexual activity: Yes    Birth control/protection: Surgical    Comment: tubal  Other Topics Concern  . Not on file  Social History Narrative   Melanie Rose is a pleasant female who is married and has 2 children, son age 58, daughter age 20.  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   Social Determinants of Health   Financial Resource Strain: Low Risk   . Difficulty of Paying Living Expenses: Not hard at all  Food Insecurity: No Food Insecurity  . Worried About Charity fundraiser in the Last Year: Never true  . Ran Out of Food in the Last Year: Never true  Transportation Needs: No Transportation Needs  . Lack of Transportation (Medical): No  . Lack of Transportation (Non-Medical): No  Physical Activity: Sufficiently Active  . Days of Exercise per Week: 5 days  . Minutes of Exercise per Session: 30 min  Stress: No Stress Concern Present  . Feeling of Stress : Not at all  Social Connections: Moderately Isolated  . Frequency of Communication with Friends and Family: More than three times a week  . Frequency of Social Gatherings with Friends and Family: More than three times a week  . Attends Religious Services: Never  . Active Member of Clubs or Organizations: No  . Attends Archivist Meetings: Never  . Marital Status: Married  Human resources officer Violence: Not At Risk  . Fear of Current or Ex-Partner: No  . Emotionally Abused: No  . Physically Abused: No  . Sexually Abused: No   Family History  Problem Relation Age of Onset  . Hypertension Mother   . Hypertension Father   . Heart disease Maternal Grandfather   . Cancer Paternal Grandfather      Objective: Office vital signs reviewed. BP 98/64   Pulse 73   Temp (!) 97.3 F (36.3 Melanie) (Temporal)   Ht 5' (1.524 m)   Wt 181 lb (82.1 kg)   LMP 06/07/2020 (Exact Date)   SpO2 98%   BMI 35.35 kg/m   Physical Examination:  General: Awake, alert, well nourished, No acute distress HEENT: Normal; sclera white.  No exophthalmos Cardio: regular rate and rhythm, S1S2 heard, no murmurs appreciated Pulm: clear to auscultation bilaterally, no wheezes, rhonchi or rales; normal work of breathing on room air Extremities: warm, well perfused, No edema, cyanosis or clubbing; + 2 pulses bilaterally MSK: Normal gait and station  Left knee: She has full active range of motion.  No tenderness palpation to the patella, patellar tendon, quad tendon or posterior popliteal fossa.  She does have tenderness palpation along the medial knee.  No pain with Thessaly maneuver.  No ligamentous laxity Skin: dry; intact; no rashes or lesions; normal temperature  Assessment/ Plan: 39 y.o. female   1. Chronic pain of left knee Trial of meloxicam.  I suspect that the knee pain is likely degenerative/posttraumatic meniscal injury.  She was given a home physical therapy instructions.  We discussed that if symptoms were not improving with oral NSAIDs and home physical therapy that the next step would be corticosteroid injection versus referral to orthopedics.  Avoid other NSAIDs while taking meloxicam.  Okay to use Tylenol if needed.  Ice okay.  Bracing okay. - meloxicam (MOBIC) 15 MG tablet; Take 0.5-1 tablets (7.5-15 mg total) by mouth daily as needed for pain.  Dispense: 30 tablet; Refill: 1  2. Hypothyroidism following radioiodine therapy Plan for lipid panel and CMP with her thyroid labs in December with endocrinology - Lipid panel; Future - CMP14+EGFR; Future  3. Obesity (BMI 30-39.9) We discussed that medical treatment is an option.  She will follow-up if she decides to proceed with this - Lipid panel;  Future - CMP14+EGFR; Future  4. Need for immunization against influenza Administered - Flu Vaccine QUAD 36+ mos IM   No orders of the defined types were placed in this encounter.  No orders of the defined types were placed in this encounter.    Melanie Norlander, DO [place] [phone]

## 2020-06-23 NOTE — Patient Instructions (Signed)
Make sure endocrine gets your labs drawn for me in December.  Please be fasting for those.  If you decide you want to pursue medical weight loss, please let me know.  For your knee:  Mobic 1/2-1 tablet daily as needed for pain/ swelling Home PT exercises provided  Plan for knee injection vs referral to orthopedics if no improvement or if worsening

## 2020-07-22 ENCOUNTER — Encounter: Payer: Self-pay | Admitting: Family Medicine

## 2020-09-02 DIAGNOSIS — E559 Vitamin D deficiency, unspecified: Secondary | ICD-10-CM | POA: Diagnosis not present

## 2020-09-02 DIAGNOSIS — E890 Postprocedural hypothyroidism: Secondary | ICD-10-CM | POA: Diagnosis not present

## 2020-10-20 ENCOUNTER — Other Ambulatory Visit (HOSPITAL_COMMUNITY): Payer: Self-pay | Admitting: Adult Health

## 2020-10-20 DIAGNOSIS — N63 Unspecified lump in unspecified breast: Secondary | ICD-10-CM

## 2020-10-24 ENCOUNTER — Encounter (HOSPITAL_COMMUNITY): Payer: BC Managed Care – PPO

## 2020-10-24 DIAGNOSIS — Z1231 Encounter for screening mammogram for malignant neoplasm of breast: Secondary | ICD-10-CM

## 2020-12-02 ENCOUNTER — Ambulatory Visit (HOSPITAL_COMMUNITY)
Admission: RE | Admit: 2020-12-02 | Discharge: 2020-12-02 | Disposition: A | Discharge status: Home / Self Care | Payer: BC Managed Care – PPO | Source: Ambulatory Visit | Attending: Adult Health | Admitting: Adult Health

## 2020-12-02 ENCOUNTER — Other Ambulatory Visit: Payer: Self-pay

## 2020-12-02 DIAGNOSIS — N63 Unspecified lump in unspecified breast: Secondary | ICD-10-CM | POA: Diagnosis not present

## 2020-12-02 DIAGNOSIS — N6312 Unspecified lump in the right breast, upper inner quadrant: Secondary | ICD-10-CM | POA: Diagnosis not present

## 2020-12-12 ENCOUNTER — Telehealth: Payer: BC Managed Care – PPO | Admitting: Family

## 2020-12-12 DIAGNOSIS — M545 Low back pain, unspecified: Secondary | ICD-10-CM

## 2020-12-12 MED ORDER — NAPROXEN 500 MG PO TABS: 500.0000 mg | ORAL_TABLET | Freq: Two times a day (BID) | ORAL | 0 refills | Status: DC

## 2020-12-12 MED ORDER — PREDNISONE 10 MG (21) PO TBPK: ORAL_TABLET | ORAL | 0 refills | Status: DC

## 2020-12-12 NOTE — Progress Notes (Signed)

## 2021-01-06 ENCOUNTER — Encounter: Payer: Self-pay | Admitting: Family Medicine

## 2021-01-20 ENCOUNTER — Other Ambulatory Visit: Payer: Self-pay

## 2021-01-20 ENCOUNTER — Encounter: Payer: Self-pay | Admitting: Family Medicine

## 2021-01-20 ENCOUNTER — Ambulatory Visit (INDEPENDENT_AMBULATORY_CARE_PROVIDER_SITE_OTHER): Payer: BC Managed Care – PPO | Admitting: Family Medicine

## 2021-01-20 VITALS — BP 96/65 | HR 75 | Temp 97.5°F | Resp 20 | Ht 60.0 in | Wt 183.0 lb

## 2021-01-20 DIAGNOSIS — E669 Obesity, unspecified: Secondary | ICD-10-CM

## 2021-01-20 DIAGNOSIS — M546 Pain in thoracic spine: Secondary | ICD-10-CM

## 2021-01-20 DIAGNOSIS — B372 Candidiasis of skin and nail: Secondary | ICD-10-CM | POA: Diagnosis not present

## 2021-01-20 DIAGNOSIS — N62 Hypertrophy of breast: Secondary | ICD-10-CM

## 2021-01-20 DIAGNOSIS — M25512 Pain in left shoulder: Secondary | ICD-10-CM

## 2021-01-20 DIAGNOSIS — G8929 Other chronic pain: Secondary | ICD-10-CM

## 2021-01-20 DIAGNOSIS — E890 Postprocedural hypothyroidism: Secondary | ICD-10-CM | POA: Diagnosis not present

## 2021-01-20 DIAGNOSIS — M542 Cervicalgia: Secondary | ICD-10-CM

## 2021-01-20 DIAGNOSIS — M5442 Lumbago with sciatica, left side: Secondary | ICD-10-CM

## 2021-01-20 DIAGNOSIS — M25511 Pain in right shoulder: Secondary | ICD-10-CM | POA: Diagnosis not present

## 2021-01-20 LAB — CMP14+EGFR
ALT: 23 IU/L (ref 0–32)
AST: 16 IU/L (ref 0–40)
Albumin/Globulin Ratio: 1.8 (ref 1.2–2.2)
Albumin: 4.6 g/dL (ref 3.8–4.8)
Alkaline Phosphatase: 81 IU/L (ref 44–121)
BUN/Creatinine Ratio: 10 (ref 9–23)
BUN: 10 mg/dL (ref 6–20)
Bilirubin Total: 0.3 mg/dL (ref 0.0–1.2)
CO2: 23 mmol/L (ref 20–29)
Calcium: 9.9 mg/dL (ref 8.7–10.2)
Chloride: 103 mmol/L (ref 96–106)
Creatinine, Ser: 0.97 mg/dL (ref 0.57–1.00)
Globulin, Total: 2.5 g/dL (ref 1.5–4.5)
Glucose: 91 mg/dL (ref 65–99)
Potassium: 4.7 mmol/L (ref 3.5–5.2)
Sodium: 138 mmol/L (ref 134–144)
Total Protein: 7.1 g/dL (ref 6.0–8.5)
eGFR: 77 mL/min/{1.73_m2} (ref >59)

## 2021-01-20 MED ORDER — NYSTATIN 100000 UNIT/GM EX POWD: 1.0000 "application " | Freq: Three times a day (TID) | CUTANEOUS | 0 refills | Status: DC

## 2021-01-20 MED ORDER — BACLOFEN 10 MG PO TABS: 10.0000 mg | ORAL_TABLET | Freq: Three times a day (TID) | ORAL | 0 refills | Status: DC | PRN

## 2021-01-20 NOTE — Patient Instructions (Signed)

## 2021-01-20 NOTE — Progress Notes (Signed)
Subjective: CC: Breast PCP: Melanie Norlander, DO Melanie Rose is a 40 y.o. female presenting to clinic today for:  1.  Large breast Patient reports that she is ready to seek evaluation by breast surgeon.  She struggles with neck pain, shoulder pain, upper back pain and low back pain.  Much of this she attributes to her enlarged breasts.  She thinks that she is a 38-I bra size but is not totally sure.  She denies any sleep apnea.  She does report occasional pain along the breast due to heaviness in the breast.  She also thinks that she has a rash under there today.  With regards to her back pain she currently resumed use of baclofen but this is several years old.  She is also using Naprosyn.  She is having some improvement with this and does not think that she needs steroids at this time.  She is fasting today and would like to get her labs collected ROS: Per HPI  No Known Allergies Past Medical History:  Diagnosis Date  . Abnormal Pap smear   . Breast mass in female    Left  . Chronic kidney disease    kidney stones  . Constipation   . Fibromyalgia   . Generalized headaches   . GERD (gastroesophageal reflux disease)   . Hemorrhoids   . History of kidney stones   . Hypothyroidism    sp radioactive  iodine therapy for hyperthyroidism   . 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  . Vaginal Pap smear, abnormal     Current Outpatient Medications:  .  baclofen (LIORESAL) 10 MG tablet, Take 1 tablet (10 mg total) by mouth 3 (three) times daily as needed for muscle spasms., Disp: 30 each, Rfl: 0 .  levothyroxine (SYNTHROID) 112 MCG tablet, Take 1 tablet (112 mcg total) by mouth daily before breakfast., Disp: 30 tablet, Rfl: 3 .  nystatin (MYCOSTATIN/NYSTOP) powder, Apply 1 application topically 3 (three) times daily., Disp: 15 g, Rfl: 0 .  meloxicam (MOBIC) 15 MG tablet, Take 0.5-1 tablets (7.5-15 mg total) by mouth daily as needed for pain., Disp: 30 tablet,  Rfl: 1 Social History   Socioeconomic History  . Marital status: Married    Spouse name: Not on file  . Number of children: Not on file  . Years of education: Not on file  . Highest education level: Not on file  Occupational History  . Not on file  Tobacco Use  . Smoking status: Former Smoker    Packs/day: 1.00    Years: 10.00    Pack years: 10.00    Types: Cigarettes    Quit date: 06/22/2009    Years since quitting: 11.5  . Smokeless tobacco: Never Used  Vaping Use  . Vaping Use: Never used  Substance and Sexual Activity  . Alcohol use: No  . Drug use: No  . Sexual activity: Yes    Birth control/protection: Surgical    Comment: tubal  Other Topics Concern  . Not on file  Social History Narrative   [name] is a pleasant female who is married and has 2 children, son age 18, daughter age 98.  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   Social Determinants of Health   Financial Resource Strain: Low Risk   . Difficulty of Paying Living Expenses: Not hard at all  Food Insecurity: No Food Insecurity  . Worried About Charity fundraiser in the Last Year: Never  true  . Ran Out of Food in the Last Year: Never true  Transportation Needs: No Transportation Needs  . Lack of Transportation (Medical): No  . Lack of Transportation (Non-Medical): No  Physical Activity: Sufficiently Active  . Days of Exercise per Week: 5 days  . Minutes of Exercise per Session: 30 min  Stress: No Stress Concern Present  . Feeling of Stress : Not at all  Social Connections: Moderately Isolated  . Frequency of Communication with Friends and Family: More than three times a week  . Frequency of Social Gatherings with Friends and Family: More than three times a week  . Attends Religious Services: Never  . Active Member of Clubs or Organizations: No  . Attends Archivist Meetings: Never  . Marital Status: Married  Human resources officer Violence: Not At Risk  . Fear of Current  or Ex-Partner: No  . Emotionally Abused: No  . Physically Abused: No  . Sexually Abused: No   Family History  Problem Relation Age of Onset  . Hypertension Mother   . Hypertension Father   . Heart disease Maternal Grandfather   . Cancer Paternal Grandfather     Objective: Office vital signs reviewed. BP 96/65   Pulse 75   Temp (!) 97.5 F (36.4 C)   Resp 20   Ht 5' (1.524 m)   Wt 183 lb (83 kg)   SpO2 97%   BMI 35.74 kg/m   Physical Examination:  General: Awake, alert, well nourished, No acute distress Cardio: regular rate; no exophthalmos.  No goiter Breast: Macromastia present Pulm: Normal work of breathing on room air MSK: Normal gait and station. 5/5 lower extremity strength.  She has visible indentations along bilateral shoulders from her bra  C-spine: No midline tenderness palpation.  She has increased paraspinal muscle tonicity.  Trapezius muscle tonicity increased.  She is tender along these muscles  Thoracic spine: No midline tenderness to palpation.  Again, increased tonicity of the paraspinal muscles.  Curvature is normal  Lumbar spine: No midline tenderness palpation.  She has hypersensitivity along the paraspinal muscles in the lumbar and thoracic spine as above.  She is got tenderness along the lumbosacral and sacroiliac areas on the left Neuro: Light touch sensation grossly intact Skin: Mild erythema with minimal maceration noted below bilateral breasts.  No evidence of secondary bacterial infection  Assessment/ Plan: 40 y.o. female   Macromastia - Plan: Ambulatory referral to Plastic Surgery  Candidal intertrigo - Plan: nystatin (MYCOSTATIN/NYSTOP) powder  Chronic bilateral thoracic back pain - Plan: baclofen (LIORESAL) 10 MG tablet  Chronic pain of both shoulders - Plan: baclofen (LIORESAL) 10 MG tablet  Chronic neck pain - Plan: baclofen (LIORESAL) 10 MG tablet  Acute left-sided back pain with sciatica - Plan: baclofen (LIORESAL) 10 MG  tablet  Postablative hypothyroidism - Plan: TSH, T4, Free, CMP14+EGFR, Lipid panel  Obesity (BMI 30-39.9) - Plan: CMP14+EGFR, Lipid panel  Referral to plastic surgery placed.  Certainly think that medically she warrants consideration for breast reduction surgery given chronic neck, shoulder and back pain.  She also has evidence of intertrigo today.  I worry about her risk of sleep apnea given the largeness of her breasts.  I have treated her with baclofen and she will continue Naprosyn for back pain and joint aches.  Of course this is not something we will fix the issue but hopefully if she can get the breast reduction as above we will see improvement in these other musculoskeletal ailments  Check  TSH, free T4, CMP and fasting lipid panel.  She will follow-up in 6 months, sooner if needed  Orders Placed This Encounter  Procedures  . TSH  . T4, Free  . Ambulatory referral to Plastic Surgery    Referral Priority:   Routine    Referral Type:   Surgical    Referral Reason:   Specialty Services Required    Requested Specialty:   Plastic Surgery    Number of Visits Requested:   1   Meds ordered this encounter  Medications  . baclofen (LIORESAL) 10 MG tablet    Sig: Take 1 tablet (10 mg total) by mouth 3 (three) times daily as needed for muscle spasms.    Dispense:  30 each    Refill:  0  . nystatin (MYCOSTATIN/NYSTOP) powder    Sig: Apply 1 application topically 3 (three) times daily.    Dispense:  15 g    Refill:  [place], DO [facility] [phone]

## 2021-01-21 LAB — LIPID PANEL
Chol/HDL Ratio: 2.7 ratio (ref 0.0–4.4)
Cholesterol, Total: 205 mg/dL — ABNORMAL HIGH (ref 100–199)
HDL: 77 mg/dL (ref >39)
LDL Chol Calc (NIH): 110 mg/dL — ABNORMAL HIGH (ref 0–99)
Triglycerides: 101 mg/dL (ref 0–149)
VLDL Cholesterol Cal: 18 mg/dL (ref 5–40)

## 2021-01-21 LAB — TSH: TSH: 2.19 u[IU]/mL (ref 0.450–4.500)

## 2021-01-21 LAB — T4, FREE: Free T4: 1.49 ng/dL (ref 0.82–1.77)

## 2021-01-23 ENCOUNTER — Telehealth: Payer: BC Managed Care – PPO | Admitting: Physician Assistant

## 2021-01-23 DIAGNOSIS — J069 Acute upper respiratory infection, unspecified: Secondary | ICD-10-CM

## 2021-01-23 MED ORDER — PREDNISONE 10 MG (21) PO TBPK: ORAL_TABLET | ORAL | 0 refills | Status: DC

## 2021-01-23 MED ORDER — BENZONATATE 100 MG PO CAPS: 100.0000 mg | ORAL_CAPSULE | Freq: Three times a day (TID) | ORAL | 0 refills | Status: DC | PRN

## 2021-01-23 NOTE — Progress Notes (Signed)
We are sorry that you are not feeling well.  Here is how we plan to help!  Based on your presentation I believe you most likely have A cough due to a virus.  This is called viral bronchitis and is best treated by rest, plenty of fluids and control of the cough.  You may use Ibuprofen or Tylenol as directed to help your symptoms.     In addition you may use A prescription cough medication called Tessalon Perles 100mg. You may take 1-2 capsules every 8 hours as needed for your cough.  Prednisone 10 mg daily for 6 days (see taper instructions below)  Directions for 6 day taper: Day 1: 2 tablets before breakfast, 1 after both lunch & dinner and 2 at bedtime Day 2: 1 tab before breakfast, 1 after both lunch & dinner and 2 at bedtime Day 3: 1 tab at each meal & 1 at bedtime Day 4: 1 tab at breakfast, 1 at lunch, 1 at bedtime Day 5: 1 tab at breakfast & 1 tab at bedtime Day 6: 1 tab at breakfast   From your responses in the eVisit questionnaire you describe inflammation in the upper respiratory tract which is causing a significant cough.  This is commonly called Bronchitis and has four common causes:    Allergies  Viral Infections  Acid Reflux  Bacterial Infection Allergies, viruses and acid reflux are treated by controlling symptoms or eliminating the cause. An example might be a cough caused by taking certain blood pressure medications. You stop the cough by changing the medication. Another example might be a cough caused by acid reflux. Controlling the reflux helps control the cough.  USE OF BRONCHODILATOR ("RESCUE") INHALERS: There is a risk from using your bronchodilator too frequently.  The risk is that over-reliance on a medication which only relaxes the muscles surrounding the breathing tubes can reduce the effectiveness of medications prescribed to reduce swelling and congestion of the tubes themselves.  Although you feel brief relief from the bronchodilator inhaler, your asthma may  actually be worsening with the tubes becoming more swollen and filled with mucus.  This can delay other crucial treatments, such as oral steroid medications. If you need to use a bronchodilator inhaler daily, several times per day, you should discuss this with your provider.  There are probably better treatments that could be used to keep your asthma under control.     HOME CARE . Only take medications as instructed by your medical team. . Complete the entire course of an antibiotic. . Drink plenty of fluids and get plenty of rest. . Avoid close contacts especially the very young and the elderly . Cover your mouth if you cough or cough into your sleeve. . Always remember to wash your hands . A steam or ultrasonic humidifier can help congestion.   GET HELP RIGHT AWAY IF: . You develop worsening fever. . You become short of breath . You cough up blood. . Your symptoms persist after you have completed your treatment plan MAKE SURE YOU   Understand these instructions.  Will watch your condition.  Will get help right away if you are not doing well or get worse.  Your e-visit answers were reviewed by a board certified advanced clinical practitioner to complete your personal care plan.  Depending on the condition, your plan could have included both over the counter or prescription medications. If there is a problem please reply  once you have received a response from your provider. Your   safety is important to [place].  If you have drug allergies check your prescription carefully.    You can use MyChart to ask questions about today's visit, request a non-urgent call back, or ask for a work or school excuse for 24 hours related to this e-Visit. If it has been greater than 24 hours you will need to follow up with your provider, or enter a new e-Visit to address those concerns. You will get an e-mail in the next two days asking about your experience.  I hope that your e-visit has been valuable and will  speed your recovery. Thank you for using e-visits.  I provided 5 minutes of non face-to-face time during this encounter for chart review and documentation.

## 2021-03-18 ENCOUNTER — Institutional Professional Consult (permissible substitution): Payer: BC Managed Care – PPO | Admitting: Plastic Surgery

## 2021-04-13 ENCOUNTER — Encounter: Payer: Self-pay | Admitting: Plastic Surgery

## 2021-04-14 ENCOUNTER — Other Ambulatory Visit: Payer: Self-pay

## 2021-04-14 ENCOUNTER — Encounter: Payer: Self-pay | Admitting: Plastic Surgery

## 2021-04-14 ENCOUNTER — Ambulatory Visit (INDEPENDENT_AMBULATORY_CARE_PROVIDER_SITE_OTHER): Payer: BC Managed Care – PPO | Admitting: Plastic Surgery

## 2021-04-14 DIAGNOSIS — N62 Hypertrophy of breast: Secondary | ICD-10-CM | POA: Diagnosis not present

## 2021-04-14 DIAGNOSIS — G8929 Other chronic pain: Secondary | ICD-10-CM | POA: Diagnosis not present

## 2021-04-14 DIAGNOSIS — M546 Pain in thoracic spine: Secondary | ICD-10-CM | POA: Diagnosis not present

## 2021-04-14 DIAGNOSIS — M549 Dorsalgia, unspecified: Secondary | ICD-10-CM | POA: Insufficient documentation

## 2021-04-14 NOTE — Progress Notes (Signed)
Patient ID: Melanie Rose, female    DOB: 29-Nov-1980, 40 y.o.   MRN: [id number]   Chief Complaint  Patient presents with   Advice Only    Mammary Hyperplasia: The patient is a 40 y.o. female with a history of mammary hyperplasia for several years.  She has extremely large breasts causing symptoms that include the following: Back pain in the upper and lower back, including neck pain. She pulls or pins her bra straps to provide better lift and relief of the pressure and pain. She notices relief by holding her breast up manually.  Her shoulder straps cause grooves and pain and pressure that requires padding for relief. Pain medication is sometimes required with motrin and tylenol.  Activities that are hindered by enlarged breasts include: exercise and running.  She has tried supportive clothing as well as fitted bras without improvement.  Her breasts are extremely large and fairly symmetric.  She has hyperpigmentation of the inframammary area on both sides.  The sternal to nipple distance on the right is 39 cm and the left is 39 cm.  The IMF distance is 20 cm.  She is 5 feet 4 inches tall and weighs 176 pounds.  The BMI = 30.2.  Preoperative bra size = I cup.  She would like to be a B cup the estimated excess breast tissue to be removed at the time of surgery = 550 grams on the left and 550 grams on the right.  Mammogram history: September 2021 and had a fibroadenoma of the right breast that was read as stable.  In 2019 she had a biopsy of the left breast and it was benign.  She is due for mammogram now..  Family history of breast cancer: No.  Tobacco use: Former.   The patient expresses the desire to pursue surgical intervention.  She has a history of hypertrophic scars from a cat scratch and from her left breast biopsy.    Review of Systems  Constitutional:  Positive for activity change. Negative for appetite change.  Eyes: Negative.   Respiratory: Negative.  Negative for chest tightness and  shortness of breath.   Cardiovascular:  Negative for leg swelling.  Gastrointestinal:  Negative for abdominal distention.  Endocrine: Negative.   Genitourinary: Negative.   Musculoskeletal:  Positive for back pain and neck pain.  Skin:  Positive for rash.  Hematological: Negative.   Psychiatric/Behavioral: Negative.     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   Vaginal Pap smear, abnormal     Past Surgical History:  Procedure Laterality Date   BREAST BIOPSY     negative   BREAST LUMPECTOMY WITH RADIOACTIVE SEED LOCALIZATION Left 03/07/2018   Procedure: LEFT BREAST LUMPECTOMY WITH RADIOACTIVE SEED LOCALIZATION;  Surgeon: Erroll Luna, MD;  Location: [facility];  Service: General;  Laterality: Left;   LAPAROSCOPIC TUBAL LIGATION Bilateral 01/16/2015   Procedure: LAPAROSCOPIC TUBAL LIGATION;  Surgeon: Newton Pigg, MD;  Location: [facility];  Service: Gynecology;  Laterality: Bilateral;   TUBAL LIGATION  Current Outpatient Medications:    levothyroxine (SYNTHROID) 112 MCG tablet, Take 1 tablet (112 mcg total) by mouth daily before breakfast., Disp: 30 tablet, Rfl: 3   baclofen (LIORESAL) 10 MG tablet, Take 1 tablet (10 mg total) by mouth 3 (three) times daily as needed for muscle spasms. (Patient not taking: Reported on 04/14/2021), Disp: 30 each, Rfl: 0   benzonatate (TESSALON) 100 MG capsule, Take 1 capsule (100 mg total) by mouth 3 (three) times daily as needed for cough. (Patient not taking: Reported on 04/14/2021), Disp: 30 capsule, Rfl: 0   meloxicam (MOBIC) 15 MG tablet, Take 0.5-1 tablets (7.5-15 mg total) by mouth  daily as needed for pain. (Patient not taking: Reported on 04/14/2021), Disp: 30 tablet, Rfl: 1   nystatin (MYCOSTATIN/NYSTOP) powder, Apply 1 application topically 3 (three) times daily. (Patient not taking: Reported on 04/14/2021), Disp: 15 g, Rfl: 0   predniSONE (STERAPRED UNI-PAK 21 TAB) 10 MG (21) TBPK tablet, 6 day taper; take as directed in package instructions (Patient not taking: Reported on 04/14/2021), Disp: 21 tablet, Rfl: 0   Objective:   Vitals:   04/14/21 1002  BP: 111/75  Pulse: 78  SpO2: 96%    Physical Exam Vitals and nursing note reviewed.  Constitutional:      Appearance: Normal appearance.  HENT:     Head: Normocephalic and atraumatic.  Cardiovascular:     Rate and Rhythm: Normal rate.     Pulses: Normal pulses.  Pulmonary:     Effort: Pulmonary effort is normal. No respiratory distress.     Breath sounds: No wheezing.  Chest:    Abdominal:     General: Abdomen is flat. There is no distension.     Tenderness: There is no abdominal tenderness.  Musculoskeletal:        General: No swelling or deformity.  Skin:    General: Skin is warm.     Capillary Refill: Capillary refill takes less than 2 seconds.     Coloration: Skin is not jaundiced.     Findings: Lesion present. No bruising or erythema.  Neurological:     General: No focal deficit present.     Mental Status: She is alert and oriented to person, place, and time.  Psychiatric:        Mood and Affect: Mood normal.        Behavior: Behavior normal.        Thought Content: Thought content normal.    Assessment & Plan:  Symptomatic mammary hypertrophy  Chronic bilateral thoracic back pain  The procedure the patient selected and that was best for the patient was discussed. The risk were discussed and include but not limited to the following:  Breast asymmetry, fluid accumulation, firmness of the breast, inability to breast feed, loss of nipple or areola, skin loss, change in skin and nipple sensation,  fat necrosis of the breast tissue, bleeding, infection and healing delay.  There are risks of anesthesia and injury to nerves or blood vessels.  Allergic reaction to tape, suture and skin glue are possible.  There will be swelling.  Any of these can lead to the need for revisional surgery.  A breast reduction has potential to interfere with diagnostic procedures in the future.  This procedure is best done when the breast is fully developed.  Changes in the breast will continue to occur over time: pregnancy, weight gain or weigh loss.    Total time: 45 minutes. This includes time spent with the patient during  the visit as well as time spent before and after the visit reviewing the chart, documenting the encounter and ordering pertinent studies. and literature emailed to the patient.   Physical therapy:  ordered Mammogram:  ordered  Bilateral breast reduction with possible liposuction.  I reiterated that the hypertrophic scarring is very likely to occur and there is not much I can do to prevent that from happening.  Patient knows to call once she is finished with physical therapy. Pictures were obtained of the patient and placed in the chart with the patient's or guardian's permission.    [place], DO

## 2021-04-30 ENCOUNTER — Ambulatory Visit: Payer: BC Managed Care – PPO | Attending: Plastic Surgery

## 2021-04-30 ENCOUNTER — Other Ambulatory Visit: Payer: Self-pay

## 2021-04-30 DIAGNOSIS — M546 Pain in thoracic spine: Secondary | ICD-10-CM | POA: Insufficient documentation

## 2021-04-30 DIAGNOSIS — M542 Cervicalgia: Secondary | ICD-10-CM | POA: Insufficient documentation

## 2021-04-30 DIAGNOSIS — R293 Abnormal posture: Secondary | ICD-10-CM | POA: Diagnosis not present

## 2021-04-30 NOTE — Therapy (Signed)
[facility] [place], [place], [address] Phone: [phone]   Fax:  [phone]  Physical Therapy Evaluation  Patient Details  Name: Melanie Rose MRN: [id number] Date of Birth: [birth year] Referring Provider (PT): Audelia Hives, DO   Encounter Date: 04/30/2021   PT End of Session - 04/30/21 1629     Visit Number 1    Number of Visits 4    Date for PT Re-Evaluation 05/28/21    PT Start Time 0320    PT Stop Time [id number]    PT Time Calculation (min) 55 min    Activity Tolerance Patient tolerated treatment well    Behavior During Therapy [facility] for tasks assessed/performed             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   Vaginal Pap smear, abnormal     Past Surgical History:  Procedure Laterality Date   BREAST BIOPSY     negative   BREAST LUMPECTOMY WITH RADIOACTIVE SEED LOCALIZATION Left 03/07/2018   Procedure: LEFT BREAST LUMPECTOMY WITH RADIOACTIVE SEED LOCALIZATION;  Surgeon: Erroll Luna, MD;  Location: [place];  Service: General;  Laterality: Left;   LAPAROSCOPIC TUBAL LIGATION Bilateral 01/16/2015   Procedure: LAPAROSCOPIC TUBAL LIGATION;  Surgeon: Newton Pigg, MD;  Location: [facility];  Service: Gynecology;  Laterality: Bilateral;   TUBAL LIGATION      There were no vitals filed for this visit.    Subjective Assessment - 04/30/21 1518     Subjective Patient states that she has had constant pain in hher back and neck. She had persistent pain in the low back as it feels that the back goes out on her. She has been doing yoga and has  been having some relief in the low back but the pain in the mid bakc and shoulders prevent her from having much relief elsewhere . She states that she would like to run but cannot because of the weight of her breast. She enjpoys swimming and doing taebo. 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 Patient states that she gets headaches, denies shortness of breath. She states that she wakes up a few times in the middle of the night because of the discomfort in her back. She denies numbness and tingling. Laying down on her back od side helps her feel better. Standing and sitting feel worse.    How long can you sit comfortably? 30 mins but needs to change positions    How long can you stand comfortably? 30 mins (maybe)    How long can you walk comfortably? 45 mins    Patient Stated Goals decrease pain, improve mvmnt, return to recreation and sport, stnad longer than 45 mins; sit longer than 60 min    Currently in Pain? Yes    Pain Score 4     Pain Location Back    Pain Orientation Mid    Pain Descriptors / Indicators Aching    Pain Type Chronic  pain    Pain Radiating Towards to the shoulders and low back    Pain Onset More than a month ago    Aggravating Factors  sitting or standing    Pain Relieving Factors laying down    Effect of Pain on Daily Activities makes me take breaks in between daily activities    Multiple Pain Sites Yes    Pain Score 4    Pain Location Shoulder    Pain Orientation Left;Right    Pain Descriptors / Indicators Aching;Tightness    Pain Type Chronic pain    Pain Radiating Towards radiates to the shoulders                [facility] PT Assessment - 04/30/21 0001       Assessment   Medical Diagnosis Hypertrophy of Breast, pain in thoracic spine, Other chronic pain.    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   Leisure Tae Bo, Yoga, Mothering 4 children      Observation/Other Assessments   Observations Healthy, well nourished adult    Skin Integrity keloid scar at the sternal region      Sensation   Light Touch Appears Intact      Posture/Postural Control   Posture/Postural Control Postural limitations    Postural Limitations Increased lumbar lordosis;Rounded Shoulders      Deep Tendon Reflexes   DTR Assessment Site Biceps;Brachioradialis;Triceps    Biceps DTR 2+    Brachioradialis DTR 2+    Triceps DTR 2+      ROM / Strength   AROM / PROM / Strength AROM;Strength      AROM   Overall AROM  Within functional limits for tasks performed    Overall AROM Comments Limited left cervical rotatio with shoulder elevaiton, restristed L thoracic rotation compared to the R. otherwaise all cervical and thoracic AROM WFL; UE ROM all WFL -no limitations    AROM Assessment Site Shoulder;Cervical;Thoracic      Strength   Overall Strength Within functional limits for tasks performed    Strength Assessment Site Shoulder    Right/Left Shoulder Right    Right Shoulder Flexion 4/5    Right Shoulder Extension 4/5    Right Shoulder ABduction 4/5      Palpation   Spinal mobility hypomobility c7-T8 ; sensitive mobility/hypermobility at T12/L1- requies sidelying endrange knee flexion to palpate; tender response of repeated spasms    Palpation comment TTP bil LS and UT, signisficant trigger point at L LS                        Objective measurements completed on examination: See above findings.       [place] Adult PT Treatment/Exercise - 04/30/21 0001       Exercises   Exercises Other  Exercises    Other Exercises  see pt instructions : 1st rib inferior mobilzation, LS stretch, thoracic ext over physioball in long sitting with cervical rotation for  CTJ mobility, HABD with ext over physioball                   Plan - 04/30/21 1513     Clinical Impression Statement Jaislyn Laurent is a  40 y.o. female with a history of mammary hyperplasia for several years.  She has extremely large breasts causing symptoms that include the following:  Back pain in the upper and lower back, including neck pain. She demonstrates limited L cervical rotation and interference of 1st rib hypomobility. She is limited with L thoracic rotation. She also presents with hypertonicity at the thoracic paraspinals bilaterally with  moderate increased lordosis at the lumbar spine. She was educated on several activites to encourage imporved thoracic mobility and increase scapular/postural strength. She is generally strong functionally however limited by pain for some repeated movments with ADLs due to breast size. At this time skilled PT is recommended to the goals of educating patient and providing interventions that support increased ease of thoracic mobility and improved strength of prolonged sitting or standing required to participate in as a caregiver.    Personal Factors and Comorbidities Comorbidity 1;Time since onset of injury/illness/exacerbation    Examination-Activity Limitations Lift;Other   Running   Stability/Clinical Decision Making Stable/Uncomplicated    Clinical Decision Making Low    PT Treatment/Interventions ADLs/Self Care Home Management;Electrical Stimulation;Therapeutic activities;Neuromuscular re-education;Therapeutic exercise;Patient/family education;Moist Heat;Dry needling;Taping    PT Next Visit Plan UBE retro, strenghten cervical region (chin tucks progresison to quadruperd) core strengeth physiobal presdowns, quad birdog variations    Consulted and Agree with Plan of Care Patient              Patient will benefit from skilled therapeutic intervention in order to improve the following deficits and impairments:  Impaired tone, Obesity, Hypomobility, Decreased mobility, Decreased endurance, Increased muscle spasms, Decreased range of motion, Decreased activity tolerance, Decreased strength, Postural dysfunction, Pain  Visit Diagnosis: Abnormal posture  Pain in thoracic spine     Problem List Patient Active Problem List   Diagnosis Date Noted   Symptomatic mammary hypertrophy 04/14/2021   Back pain 04/14/2021   Breast pain, left 05/13/2020   Encounter for gynecological examination with Papanicolaou smear of cervix 05/13/2020   Multinodular goiter 02/06/2018   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 [place], [place], [address] Phone: [phone]   Fax:  [phone]  Name: BIATRIZ HUSIC MRN: [id number] Date of Birth: 06-17-1981

## 2021-04-30 NOTE — Addendum Note (Signed)
Addended by: Marylou Mccoy on: 04/30/2021 04:38 PM   Modules accepted: Orders

## 2021-05-01 ENCOUNTER — Other Ambulatory Visit (HOSPITAL_COMMUNITY): Payer: Self-pay | Admitting: Radiology

## 2021-05-04 ENCOUNTER — Telehealth: Payer: BC Managed Care – PPO | Admitting: Family Medicine

## 2021-05-04 DIAGNOSIS — L089 Local infection of the skin and subcutaneous tissue, unspecified: Secondary | ICD-10-CM

## 2021-05-04 DIAGNOSIS — B958 Unspecified staphylococcus as the cause of diseases classified elsewhere: Secondary | ICD-10-CM | POA: Diagnosis not present

## 2021-05-04 MED ORDER — SULFAMETHOXAZOLE-TRIMETHOPRIM 800-160 MG PO TABS: 1.0000 | ORAL_TABLET | Freq: Two times a day (BID) | ORAL | 0 refills | Status: DC

## 2021-05-04 NOTE — Progress Notes (Signed)
E Visit for Cellulitis  We are sorry that you are not feeling well. Here is how we plan to help!  Based on what you shared with me it looks like you have cellulitis.  Cellulitis looks like areas of skin redness, swelling, and warmth; it develops as a result of bacteria entering under the skin. Little red spots and/or bleeding can be seen in skin, and tiny surface sacs containing fluid can occur. Fever can be present. Cellulitis is almost always on one side of a body, and the lower limbs are the most common site of involvement.   I have prescribed:  Bactrim DS 1 tablet by mouth twice a day for 7 days  HOME CARE:  Take your medications as ordered and take all of them, even if the skin irritation appears to be healing.   GET HELP RIGHT AWAY IF:  Symptoms that don't begin to go away within 48 hours. Severe redness persists or worsens If the area turns color, spreads or swells. If it blisters and opens, develops yellow-brown crust or bleeds. You develop a fever or chills. If the pain increases or becomes unbearable.  Are unable to keep fluids and food down.  MAKE SURE YOU   Understand these instructions. Will watch your condition. Will get help right away if you are not doing well or get worse.  Thank you for choosing an e-visit.  Your e-visit answers were reviewed by a board certified advanced clinical practitioner to complete your personal care plan. Depending upon the condition, your plan could have included both over the counter or prescription medications.  Please review your pharmacy choice. Make sure the pharmacy is open so you can pick up prescription now. If there is a problem, you may contact your provider through [employer] and have the prescription routed to another pharmacy.  Your safety is important to [place]. If you have drug allergies check your prescription carefully.   For the next 24 hours you can use MyChart to ask questions about today's visit, request a  non-urgent call back, or ask for a work or school excuse. You will get an email in the next two days asking about your experience. I hope that your e-visit has been valuable and will speed your recovery.   I provided 5 minutes of non face-to-face time during this encounter for chart review, medication and order placement, as well as and documentation.

## 2021-05-05 ENCOUNTER — Other Ambulatory Visit (HOSPITAL_COMMUNITY): Payer: Self-pay | Admitting: Adult Health

## 2021-05-05 DIAGNOSIS — R928 Other abnormal and inconclusive findings on diagnostic imaging of breast: Secondary | ICD-10-CM

## 2021-05-06 ENCOUNTER — Other Ambulatory Visit: Payer: Self-pay

## 2021-05-06 ENCOUNTER — Ambulatory Visit (INDEPENDENT_AMBULATORY_CARE_PROVIDER_SITE_OTHER): Payer: BC Managed Care – PPO | Admitting: Family Medicine

## 2021-05-06 ENCOUNTER — Encounter: Payer: Self-pay | Admitting: Family Medicine

## 2021-05-06 VITALS — BP 107/75 | HR 82 | Temp 97.8°F | Ht 64.0 in | Wt 173.4 lb

## 2021-05-06 DIAGNOSIS — T3695XA Adverse effect of unspecified systemic antibiotic, initial encounter: Secondary | ICD-10-CM | POA: Diagnosis not present

## 2021-05-06 DIAGNOSIS — L0103 Bullous impetigo: Secondary | ICD-10-CM

## 2021-05-06 MED ORDER — DOXYCYCLINE HYCLATE 100 MG PO TABS: 100.0000 mg | ORAL_TABLET | Freq: Two times a day (BID) | ORAL | 0 refills | Status: AC

## 2021-05-06 NOTE — Progress Notes (Signed)
Subjective:  Patient ID: Melanie Rose, female    DOB: 1980/09/09, 40 y.o.   MRN: [id number]  Patient Care Team: Janora Norlander, DO as PCP - General (Family Medicine)   Chief Complaint:  Rash   HPI: Melanie Rose is a 40 y.o. female presenting on 05/06/2021 for Rash   Pt presents today for evaluation of rash to left forearm and rash to bilateral axilla. Pt states she had a few scrapes on her left forearm which then turned into erythematous blisters. She did an E-Visit and was started on Bactrim for cellulitis. States a day after starting the Bactrim she developed an itchy, rased rash to bilateral axilla. No angioedema, dyspnea, stridor, or wheezing. No known contacts to possible causative agents.   Rash This is a new problem. The current episode started in the past 7 days. The problem has been gradually worsening since onset. The affected locations include the left axilla, right axilla and left arm. The rash is characterized by blistering, redness and itchiness. It is unknown if there was an exposure to a precipitant. Pertinent negatives include no anorexia, congestion, cough, diarrhea, eye pain, facial edema, fatigue, fever, joint pain, nail changes, rhinorrhea, shortness of breath, sore throat or vomiting. Past treatments include antibiotics. The treatment provided no relief.    Relevant past medical, surgical, family, and social history reviewed and updated as indicated.  Allergies and medications reviewed and updated. Data reviewed: Chart in Epic.   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   Vaginal Pap  smear, abnormal     Past Surgical History:  Procedure Laterality Date   BREAST BIOPSY     negative   BREAST LUMPECTOMY WITH RADIOACTIVE SEED LOCALIZATION Left 03/07/2018   Procedure: LEFT BREAST LUMPECTOMY WITH RADIOACTIVE SEED LOCALIZATION;  Surgeon: Erroll Luna, MD;  Location: [place];  Service: General;  Laterality: Left;   LAPAROSCOPIC TUBAL LIGATION Bilateral 01/16/2015   Procedure: LAPAROSCOPIC TUBAL LIGATION;  Surgeon: Newton Pigg, MD;  Location: [facility];  Service: Gynecology;  Laterality: Bilateral;   TUBAL LIGATION      No Known Allergies  Review of Systems  Constitutional:  Negative for activity change, appetite change, chills, diaphoresis, fatigue, fever and unexpected weight change.  HENT: Negative.  Negative for congestion, rhinorrhea, sore throat, trouble swallowing and voice change.   Eyes: Negative.  Negative for photophobia, pain, redness, itching and visual disturbance.  Respiratory:  Negative for apnea, cough, choking, chest tightness, shortness of breath, wheezing and stridor.   Cardiovascular:  Negative for chest pain, palpitations and leg swelling.  Gastrointestinal:  Negative for abdominal pain, anorexia, blood in stool, constipation, diarrhea, nausea and vomiting.  Endocrine: Negative.   Genitourinary:  Negative for decreased urine volume, difficulty urinating, dysuria, frequency and urgency.  Musculoskeletal:  Negative for arthralgias, joint pain and myalgias.  Skin:  Positive for color change, rash and wound. Negative for nail changes and pallor.  Allergic/Immunologic: Negative.   Neurological:  Negative for dizziness, tremors, seizures, syncope, facial asymmetry, speech difficulty, weakness, light-headedness, numbness and headaches.  Hematological: Negative.   Psychiatric/Behavioral:  Negative for confusion, hallucinations, sleep disturbance and suicidal ideas.   All other systems reviewed and are negative.      Objective:  BP 107/75   Pulse 82   Temp  97.8 F (36.6 C) (Temporal)   Ht '5\' 4"'$  (1.626 m)   Wt 173 lb 6.4 oz (78.7 kg)   LMP 04/22/2021   SpO2 98%   BMI 29.76 kg/m    Wt Readings from Last 3 Encounters:  05/06/21 173 lb 6.4 oz (78.7 kg)  04/14/21 176 lb 3.2 oz (79.9 kg)  01/20/21 183 lb (83 kg)    Physical Exam Vitals and nursing note reviewed.  Constitutional:      General: She is not in acute distress.    Appearance: Normal appearance. She is well-developed and well-groomed. She is obese. She is not ill-appearing, toxic-appearing or diaphoretic.  HENT:     Head: Normocephalic and atraumatic.     Jaw: There is normal jaw occlusion.     Right Ear: Hearing normal.     Left Ear: Hearing normal.     Nose: Nose normal.     Mouth/Throat:     Lips: Pink.     Mouth: Mucous membranes are moist.     Pharynx: Oropharynx is clear. Uvula midline.  Eyes:     General: Lids are normal.     Extraocular Movements: Extraocular movements intact.     Conjunctiva/sclera: Conjunctivae normal.     Pupils: Pupils are equal, round, and reactive to light.  Neck:     Thyroid: No thyroid mass, thyromegaly or thyroid tenderness.     Vascular: No carotid bruit or JVD.     Trachea: Trachea and phonation normal.  Cardiovascular:     Rate and Rhythm: Normal rate and regular rhythm.     Chest Wall: PMI is not displaced.     Pulses: Normal pulses.     Heart sounds: Normal heart sounds. No murmur heard.   No friction rub. No gallop.  Pulmonary:     Effort: Pulmonary effort is normal. No respiratory distress.     Breath sounds: Normal breath sounds. No wheezing.  Abdominal:     General: Bowel sounds are normal. There is no distension or abdominal bruit.     Palpations: Abdomen is soft. There is no hepatomegaly or splenomegaly.     Tenderness: There is no abdominal tenderness. There is no right CVA tenderness or left CVA tenderness.     Hernia: No hernia is present.  Musculoskeletal:        General: Normal range of motion.     Cervical  back: Normal range of motion and neck supple.     Right lower leg: No edema.     Left lower leg: No edema.  Lymphadenopathy:     Cervical: No cervical adenopathy.  Skin:    General: Skin is warm and dry.     Capillary Refill: Capillary refill takes less than 2 seconds.     Coloration: Skin is not cyanotic, jaundiced or pale.     Findings: Erythema and rash present. Rash is papular and vesicular.     Comments: Blistering, erythematous rash to left forearm. Raised, pruritic papular rash to bilateral axilla.   Neurological:     General: No focal deficit present.     Mental Status: She is alert and oriented to person, place, and time.     Cranial Nerves: Cranial nerves are intact. No cranial nerve deficit.  Sensory: Sensation is intact. No sensory deficit.     Motor: Motor function is intact. No weakness.     Coordination: Coordination is intact. Coordination normal.     Gait: Gait is intact. Gait normal.     Deep Tendon Reflexes: Reflexes are normal and symmetric. Reflexes normal.  Psychiatric:        Attention and Perception: Attention and perception normal.        Mood and Affect: Mood and affect normal.        Speech: Speech normal.        Behavior: Behavior normal. Behavior is cooperative.        Thought Content: Thought content normal.        Cognition and Memory: Cognition and memory normal.        Judgment: Judgment normal.          Pertinent labs & imaging results that were available during my care of the patient were reviewed by me and considered in my medical decision making.  Assessment & Plan:  Melanie Rose was seen today for rash.  Diagnoses and all orders for this visit:  Bullous impetigo Rash as noted, scratches to skin initially then development of erythematous blisters. Classic bullous impetigo. Will change bactrim to doxycycline due to possible allergic reaction to bactrim. Symptomatic care discussed in detail. Pt aware to report any new, worsening, or persistent  symptoms.  -     doxycycline (VIBRA-TABS) 100 MG tablet; Take 1 tablet (100 mg total) by mouth 2 (two) times daily for 10 days. 1 po bid  Allergic reaction due to antibacterial drug Slightly raised pruritic rash to bilateral axilla which started after initiation of Bactrim. Likely allergic reaction to drug. Bactrim stopped today. Symptomatic care discussed in detail. No signs of anaphylaxis. Report any new, worsening, or persistent symptoms.     Continue all other maintenance medications.  Follow up plan: Return if symptoms worsen or fail to improve.   Continue healthy lifestyle choices, including diet (rich in fruits, vegetables, and lean proteins, and low in salt and simple carbohydrates) and exercise (at least 30 minutes of moderate physical activity daily).  Educational handout given for impetigo  The above assessment and management plan was discussed with the patient. The patient verbalized understanding of and has agreed to the management plan. Patient is aware to call the clinic if they develop any new symptoms or if symptoms persist or worsen. Patient is aware when to return to the clinic for a follow-up visit. Patient educated on when it is appropriate to go to the emergency department.   Monia Pouch, FNP-C [facility] [phone]

## 2021-05-07 ENCOUNTER — Ambulatory Visit: Payer: BC Managed Care – PPO | Attending: Plastic Surgery

## 2021-05-07 DIAGNOSIS — R293 Abnormal posture: Secondary | ICD-10-CM | POA: Insufficient documentation

## 2021-05-07 DIAGNOSIS — M546 Pain in thoracic spine: Secondary | ICD-10-CM | POA: Insufficient documentation

## 2021-05-07 DIAGNOSIS — M542 Cervicalgia: Secondary | ICD-10-CM | POA: Diagnosis not present

## 2021-05-07 NOTE — Therapy (Signed)
[facility] [place], [place], [address] Phone: [phone]   Fax:  [phone]  Physical Therapy Treatment  Patient Details  Name: Melanie Rose MRN: [id number] Date of Birth: 07-30-1981 Referring Provider (PT): Audelia Hives, DO   Encounter Date: 05/07/2021   PT End of Session - 05/07/21 1024     Visit Number 2    Number of Visits 4    Date for PT Re-Evaluation 05/28/21    PT Start Time 0902    PT Stop Time 0945    PT Time Calculation (min) 43 min             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   Vaginal Pap smear, abnormal     Past Surgical History:  Procedure Laterality Date   BREAST BIOPSY     negative   BREAST LUMPECTOMY WITH RADIOACTIVE SEED LOCALIZATION Left 03/07/2018   Procedure: LEFT BREAST LUMPECTOMY WITH RADIOACTIVE SEED LOCALIZATION;  Surgeon: Erroll Luna, MD;  Location: [facility];  Service: General;  Laterality: Left;   LAPAROSCOPIC TUBAL LIGATION Bilateral 01/16/2015   Procedure: LAPAROSCOPIC TUBAL LIGATION;  Surgeon: Newton Pigg, MD;  Location: [facility];  Service: Gynecology;  Laterality: Bilateral;   TUBAL LIGATION      There were no vitals filed for this visit.   Subjective Assessment - 05/07/21 0910     Subjective COVID-19 screening performed upon arrival. Patient states that her back went out on her the other day and that she has a headache today. She states that she was able to do her exercises with a little more ease since the first day of therapy ( 1st rib mobilization specifically).    How long can you sit  comfortably? 30 mins but needs to change positions    How long can you stand comfortably? 30 mins (maybe)    How long can you walk comfortably? 45 mins    Patient Stated Goals decrease pain, improve mvmnt, return to recreation and sport, stnad longer than 45 mins; sit longer than 60 min    Currently in Pain? Yes    Pain Score 3     Pain Location Shoulder    Pain Orientation Right    Pain Descriptors / Indicators Aching    Pain Type Chronic pain    Pain Onset More than a month ago                               [facility] Adult PT Treatment/Exercise - 05/07/21 0001       Exercises   Exercises Shoulder;Lumbar      Lumbar Exercises: Aerobic   Nustep 8 mins      Lumbar Exercises: Standing   Wall Slides 10 reps    Wall Slides Limitations with posterior pelvic tilt and over head raise for thoraco lumbar mobility.    Shoulder Extension Strengthening;15 reps    Theraband Level (Shoulder Extension) --   Blue Cook band with marhcing   Other  Standing Lumbar Exercises physioball roll up the wall x 1 min    Other Standing Lumbar Exercises hands on plinth cat/cow thoracic mobility/      Lumbar Exercises: Seated   Other Seated Lumbar Exercises seated THORACIC rotation (elbows to pockets) x 10 each direction    Other Seated Lumbar Exercises lumbar extension machine2 x 10 (60#)      Shoulder Exercises: Standing   Other Standing Exercises open book standing wtih green tband.      Manual Therapy   Manual Therapy Joint mobilization;Soft tissue mobilization    Joint Mobilization CPA/UPA gr II-III at C4-T3    Soft tissue mobilization gentle STM to UT and LS in prone                    PT Education - 05/07/21 0944     Education Details Continue previous HEP - incorporate open book in standing without resistance but with arm at chest to reduce pain in shoulder.    Person(s) Educated Patient    Methods Explanation;Demonstration    Comprehension Verbalized  understanding;Returned demonstration              PT Short Term Goals - 04/30/21 1630       PT SHORT TERM GOAL #1   Title STGs = LTGs    Time 4    Period Weeks               PT Long Term Goals - 04/30/21 1630       PT LONG TERM GOAL #1   Title Patient to be independent with initial HEP    Baseline provided initial HEP at initial eval (TBA)    Time 4    Period Weeks    Status New    Target Date 05/28/21      PT LONG TERM GOAL #2   Title Patient will be able to improve bilateral cervical rotation to equal quality and distance without limitation.    Baseline Restricted L rotation by approx 15% comparaed to R    Time 4    Period Weeks    Status New    Target Date 05/28/21      PT LONG TERM GOAL #3   Title Patient will be able to reports increased ease of sitting greater than 60 mins with less than 2/10 discomfort    Baseline pain 4/10 with prolonged sitting    Time 4    Period Weeks    Status New    Target Date 05/28/21      PT LONG TERM GOAL #4   Title Patient will be able to discuss multidisciplinary approach to reach end-goal of increased ease of running without pain.    Time 4    Period Weeks    Status New    Target Date 05/28/21                   Plan - 05/07/21 1024     Clinical Impression Statement Therapy focused on encouraging lumbar strength, thoracic mobility, and reduce cervicothoracic discomfort. She presents with moderate thoracic kyphosis and is appropriate to continue skilled PT up to date of surgical procedure of mammillary reduction. She expresses apprehension for upcomin gprocedure but accepting of therapy input on recommended HEP to continue doing for prehabilitaiton.    Personal Factors and Comorbidities Comorbidity 1;Time since onset of injury/illness/exacerbation    Examination-Activity Limitations Lift;Other    Stability/Clinical Decision Making Stable/Uncomplicated    Clinical Decision  Making Low    Rehab Potential  Excellent    PT Frequency 1x / week    PT Duration 4 weeks    PT Treatment/Interventions ADLs/Self Care Home Management;Electrical Stimulation;Therapeutic activities;Neuromuscular re-education;Therapeutic exercise;Patient/family education;Moist Heat;Dry needling;Taping    PT Next Visit Plan UBE retro, strenghten cervical region (chin tucks progresison to quadruperd) core strengeth physiobal presdowns, quad birdog variations    Consulted and Agree with Plan of Care Patient             Patient will benefit from skilled therapeutic intervention in order to improve the following deficits and impairments:  Impaired tone, Obesity, Hypomobility, Decreased mobility, Decreased endurance, Increased muscle spasms, Decreased range of motion, Decreased activity tolerance, Decreased strength, Postural dysfunction, Pain  Visit Diagnosis: Abnormal posture  Pain in thoracic spine  Cervicalgia     Problem List Patient Active Problem List   Diagnosis Date Noted   Symptomatic mammary hypertrophy 04/14/2021   Back pain 04/14/2021   Breast pain, left 05/13/2020   Encounter for gynecological examination with Papanicolaou smear of cervix 05/13/2020   Multinodular goiter 02/06/2018   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

## 2021-05-08 ENCOUNTER — Encounter: Payer: Self-pay | Admitting: Family Medicine

## 2021-05-08 ENCOUNTER — Other Ambulatory Visit: Payer: Self-pay

## 2021-05-08 ENCOUNTER — Ambulatory Visit (INDEPENDENT_AMBULATORY_CARE_PROVIDER_SITE_OTHER): Payer: BC Managed Care – PPO | Admitting: Family Medicine

## 2021-05-08 VITALS — BP 114/76 | HR 99 | Ht 64.0 in | Wt 173.0 lb

## 2021-05-08 DIAGNOSIS — L139 Bullous disorder, unspecified: Secondary | ICD-10-CM | POA: Diagnosis not present

## 2021-05-08 MED ORDER — METHYLPREDNISOLONE ACETATE 40 MG/ML IJ SUSP
80.0000 mg | Freq: Once | INTRAMUSCULAR | Status: AC
  Administered 2021-05-08: 80 mg via INTRAMUSCULAR

## 2021-05-08 MED ORDER — PREDNISONE 20 MG PO TABS: ORAL_TABLET | ORAL | 0 refills | Status: DC

## 2021-05-08 NOTE — Progress Notes (Signed)
BP 114/76   Pulse 99   Ht '5\' 4"'$  (1.626 m)   Wt 173 lb (78.5 kg)   LMP 04/22/2021   SpO2 99%   BMI 29.70 kg/m    Subjective:   Patient ID: Melanie Rose, female    DOB: 17-May-1981, 40 y.o.   MRN: [id number]  HPI: Melanie Rose is a 40 y.o. female presenting on 05/08/2021 for Blister (Left forearm) and Rash (Right axilla/right breast)   HPI Rash Patient is coming in for a rash, started on her left forearm has some small scratches that she got when she was outside feeding the chickens and then has blistered up and spread, they initially gave her Bactrim and then she had an allergic reaction and ended up with a different rash under both axillas that is improving now that she is off of the Bactrim.  She has been on the doxycycline for 2 days and has not seen improvement and actually the rash on her forearm has worsened.  The rash is very pruritic and has been draining a clear drainage.  The rash is spread to her arms and now a couple of spots just above her lip over the past couple days.  Relevant past medical, surgical, family and social history reviewed and updated as indicated. Interim medical history since our last visit reviewed. Allergies and medications reviewed and updated.  Review of Systems  Constitutional:  Negative for chills and fever.  Eyes:  Negative for visual disturbance.  Respiratory:  Negative for chest tightness and shortness of breath.   Cardiovascular:  Negative for chest pain and leg swelling.  Skin:  Positive for rash. Negative for color change.  Neurological:  Negative for light-headedness and headaches.  Psychiatric/Behavioral:  Negative for agitation and behavioral problems.   All other systems reviewed and are negative.  Per HPI unless specifically indicated above   Allergies as of 05/08/2021       Reactions   Sulfa Antibiotics Rash        Medication List        Accurate as of May 08, 2021  4:45 PM. If you have any questions, ask your nurse or  doctor.          doxycycline 100 MG tablet Commonly known as: VIBRA-TABS Take 1 tablet (100 mg total) by mouth 2 (two) times daily for 10 days. 1 po bid   levothyroxine 112 MCG tablet Commonly known as: SYNTHROID Take 1 tablet (112 mcg total) by mouth daily before breakfast.   predniSONE 20 MG tablet Commonly known as: DELTASONE Take 3 tabs daily for 1 week, then 2 tabs daily for week 2, then 1 tab daily for week 3. Started by: Fransisca Kaufmann Luqman Perrelli, MD         Objective:   BP 114/76   Pulse 99   Ht '5\' 4"'$  (1.626 m)   Wt 173 lb (78.5 kg)   LMP 04/22/2021   SpO2 99%   BMI 29.70 kg/m   Wt Readings from Last 3 Encounters:  05/08/21 173 lb (78.5 kg)  05/06/21 173 lb 6.4 oz (78.7 kg)  04/14/21 176 lb 3.2 oz (79.9 kg)    Physical Exam Vitals and nursing note reviewed.  Constitutional:      General: She is not in acute distress.    Appearance: She is well-developed. She is not diaphoretic.  Eyes:     Conjunctiva/sclera: Conjunctivae normal.  Skin:    General: Skin is warm and dry.  Findings: Rash present.  Neurological:     Mental Status: She is alert and oriented to person, place, and time.     Coordination: Coordination normal.  Psychiatric:        Behavior: Behavior normal.    Assessment & Plan:   Problem List Items Addressed This Visit   None Visit Diagnoses     Bullous dermatitis    -  Primary   Relevant Medications   methylPREDNISolone acetate (DEPO-MEDROL) injection 80 mg   predniSONE (DELTASONE) 20 MG tablet       Likely allergic none, not infectious, no improvement with doxycycline.  We will try steroids. Follow up plan: Return if symptoms worsen or fail to improve.  Counseling provided for all of the vaccine components No orders of the defined types were placed in this encounter.   Caryl Pina, MD [facility] 05/08/2021, 4:45 PM

## 2021-05-14 ENCOUNTER — Other Ambulatory Visit: Payer: Self-pay

## 2021-05-14 ENCOUNTER — Encounter: Payer: Self-pay | Admitting: Adult Health

## 2021-05-14 ENCOUNTER — Ambulatory Visit (INDEPENDENT_AMBULATORY_CARE_PROVIDER_SITE_OTHER): Payer: BC Managed Care – PPO | Admitting: Adult Health

## 2021-05-14 VITALS — BP 110/76 | HR 58 | Ht 60.0 in | Wt 171.2 lb

## 2021-05-14 DIAGNOSIS — Z01419 Encounter for gynecological examination (general) (routine) without abnormal findings: Secondary | ICD-10-CM | POA: Insufficient documentation

## 2021-05-14 DIAGNOSIS — M25519 Pain in unspecified shoulder: Secondary | ICD-10-CM

## 2021-05-14 DIAGNOSIS — M542 Cervicalgia: Secondary | ICD-10-CM | POA: Diagnosis not present

## 2021-05-14 DIAGNOSIS — N62 Hypertrophy of breast: Secondary | ICD-10-CM | POA: Diagnosis not present

## 2021-05-14 DIAGNOSIS — N926 Irregular menstruation, unspecified: Secondary | ICD-10-CM | POA: Insufficient documentation

## 2021-05-14 NOTE — Progress Notes (Signed)
Patient ID: Melanie Rose, female   DOB: [birth year], 40 y.o.   MRN: [id number] History of Present Illness: Melanie Rose is a 40 year old white female,married, G2P2 in for a well woman gyn exam. She is hoping to have breast reduction this year(Dr Dillingham), she wears a 36 I and has neck and shoulder pain and ha indents. She says will get heat rash under breast too. Periods are irregular now, may spot after sex before period and then first day light and second day heavy with clots, has noticed about 4-5 months now. Lab Results  Component Value Date   DIAGPAP  05/13/2020    - Negative for intraepithelial lesion or malignancy (NILM)   HPV NOT DETECTED 03/20/2018   [place] Negative 05/13/2020    PCP is Dr Melanie Rose.  Current Medications, Allergies, Past Medical History, Past Surgical History, Family History and Social History were reviewed in [employer] record.     Review of Systems: Patient denies any headaches, hearing loss, fatigue, blurred vision, shortness of breath, chest pain, abdominal pain, problems with bowel movements, urination, or intercourse. No joint pain or mood swings.  See HPI for positives.   Physical Exam:BP 110/76 (BP Location: Left Arm, Patient Position: Sitting, Cuff Size: Normal)   Pulse (!) 58   Ht 5' (1.524 m)   Wt 171 lb 3.2 oz (77.7 kg)   LMP 04/21/2021 (Exact Date)   BMI 33.44 kg/m   General:  Well developed, well nourished, no acute distress Skin:  Warm and dry Neck:  Midline trachea, normal thyroid, good ROM, no lymphadenopathy Lungs; Clear to auscultation bilaterally Breast:  No dominant palpable mass, retraction, or nipple discharge,large breasts, has keloid mid chest,from cat claw Cardiovascular: Regular rate and rhythm Abdomen:  Soft, non tender, no hepatosplenomegaly Pelvic:  External genitalia is normal in appearance, no lesions.  The vagina is normal in appearance. Urethra has no lesions or masses. The cervix is bulbous.  Uterus is  felt to be normal size, shape, and contour.  No adnexal masses or tenderness noted.Bladder is non tender, no masses felt. Rectal: Deferred Extremities/musculoskeletal:  No swelling or varicosities noted, no clubbing or cyanosis,has resolving poison oak left forearm. Psych:  No mood changes, alert and cooperative,seems happy AA is 0  Fall risk is low Depression screen [facility] 2/9 05/14/2021 05/08/2021 05/06/2021  Decreased Interest 0 0 0  Down, Depressed, Hopeless 0 0 0  PHQ - 2 Score 0 0 0  Altered sleeping 0 - -  Tired, decreased energy 0 - -  Change in appetite 0 - -  Feeling bad or failure about yourself  0 - -  Trouble concentrating 0 - -  Moving slowly or fidgety/restless 0 - -  Suicidal thoughts 0 - -  PHQ-9 Score 0 - -  Difficult doing work/chores - - -    GAD 7 : Generalized Anxiety Score 05/14/2021  Nervous, Anxious, on Edge 0  Control/stop worrying 0  Worry too much - different things 0  Trouble relaxing 0  Restless 0  Easily annoyed or irritable 0  Afraid - awful might happen 0  Total GAD 7 Score 0  Anxiety Difficulty Not difficult at all    Upstream - 05/14/21 [id number]       Pregnancy Intention Screening   Does the patient want to become pregnant in the next year? No    Does the patient's partner want to become pregnant in the next year? No    Would the patient like to  discuss contraceptive options today? No      Contraception Wrap Up   Current Method Female Sterilization    End Method Female Sterilization    Contraception Counseling Provided No              Examination chaperoned by Celene Squibb LPN  Impression and Plan: 1. Encounter for well woman exam with routine gynecological exam Physical in 1 year Pap 2024 Labs PCP Mammogram as scheduled   2. Large breasts  3. Neck and shoulder pain  4. Irregular menstrual bleeding Let me know if continues, could try lysteda or low dose COC

## 2021-05-22 ENCOUNTER — Other Ambulatory Visit: Payer: Self-pay

## 2021-05-22 ENCOUNTER — Ambulatory Visit: Payer: BC Managed Care – PPO | Admitting: *Deleted

## 2021-05-22 DIAGNOSIS — M546 Pain in thoracic spine: Secondary | ICD-10-CM | POA: Diagnosis not present

## 2021-05-22 DIAGNOSIS — R293 Abnormal posture: Secondary | ICD-10-CM | POA: Diagnosis not present

## 2021-05-22 DIAGNOSIS — M542 Cervicalgia: Secondary | ICD-10-CM

## 2021-05-22 NOTE — Therapy (Signed)
[facility] [place], [place], [address] Phone: [phone]   Fax:  [phone]  Physical Therapy Treatment  Patient Details  Name: Melanie Rose MRN: [id number] Date of Birth: 02-Dec-1980 Referring Provider (PT): Audelia Hives, DO   Encounter Date: 05/22/2021   PT End of Session - 05/22/21 0948     Visit Number 3    Number of Visits 4    Date for PT Re-Evaluation 05/28/21    PT Start Time 0900    PT Stop Time 0948    PT Time Calculation (min) 48 min             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   Vaginal Pap smear, abnormal     Past Surgical History:  Procedure Laterality Date   BREAST BIOPSY     negative   BREAST LUMPECTOMY WITH RADIOACTIVE SEED LOCALIZATION Left 03/07/2018   Procedure: LEFT BREAST LUMPECTOMY WITH RADIOACTIVE SEED LOCALIZATION;  Surgeon: Erroll Luna, MD;  Location: [facility];  Service: General;  Laterality: Left;   LAPAROSCOPIC TUBAL LIGATION Bilateral 01/16/2015   Procedure: LAPAROSCOPIC TUBAL LIGATION;  Surgeon: Newton Pigg, MD;  Location: [facility];  Service: Gynecology;  Laterality: Bilateral;   TUBAL LIGATION      There were no vitals filed for this visit.   Subjective Assessment - 05/22/21 0909     Subjective COVID-19 screening performed upon arrival. Patient states that her back went out on her the other week. 2/10 pain today    How long can you sit comfortably? 30 mins but needs to change positions    How long can you stand comfortably? 30 mins (maybe)    How long can you walk comfortably? 45 mins     Patient Stated Goals decrease pain, improve mvmnt, return to recreation and sport, stnad longer than 45 mins; sit longer than 60 min    Pain Score 2     Pain Location Shoulder    Pain Orientation Right    Pain Descriptors / Indicators Aching    Pain Type Chronic pain    Pain Onset More than a month ago                               [facility] Adult PT Treatment/Exercise - 05/22/21 0001       Exercises   Exercises Shoulder;Lumbar      Lumbar Exercises: Standing   Row Strengthening;20 reps   XTS blue 5 sec holds   Shoulder Extension Strengthening;15 reps   Blue XTS hold 5 secs   Other Standing Lumbar Exercises Hinge bending with "founder " pos.x6 hold 10 secs with focus on posterior chain activation      Shoulder Exercises: ROM/Strengthening   "W" Arms Posture ex against wall with W arms x 10    Other ROM/Strengthening Exercises Reviewed HEP      Shoulder Exercises: Isometric Strengthening   Other Isometric Exercises H-ABD with yellow Tband 2x10 with 3-5 sec holds  PT Short Term Goals - 04/30/21 1630       PT SHORT TERM GOAL #1   Title STGs = LTGs    Time 4    Period Weeks               PT Long Term Goals - 04/30/21 1630       PT LONG TERM GOAL #1   Title Patient to be independent with initial HEP    Baseline provided initial HEP at initial eval (TBA)    Time 4    Period Weeks    Status New    Target Date 05/28/21      PT LONG TERM GOAL #2   Title Patient will be able to improve bilateral cervical rotation to equal quality and distance without limitation.    Baseline Restricted L rotation by approx 15% comparaed to R    Time 4    Period Weeks    Status New    Target Date 05/28/21      PT LONG TERM GOAL #3   Title Patient will be able to reports increased ease of sitting greater than 60 mins with less than 2/10 discomfort    Baseline pain 4/10 with prolonged sitting    Time 4    Period Weeks    Status  New    Target Date 05/28/21      PT LONG TERM GOAL #4   Title Patient will be able to discuss multidisciplinary approach to reach end-goal of increased ease of running without pain.    Time 4    Period Weeks    Status New    Target Date 05/28/21                   Plan - 05/22/21 0902     Clinical Impression Statement Pt arrived today doing fairly well with low pain levels. Rx focused on posture exs and strengthening posterior chain musculature.    Personal Factors and Comorbidities Comorbidity 1;Time since onset of injury/illness/exacerbation    Stability/Clinical Decision Making Stable/Uncomplicated    Rehab Potential Excellent    PT Frequency 1x / week    PT Duration 4 weeks    PT Treatment/Interventions ADLs/Self Care Home Management;Electrical Stimulation;Therapeutic activities;Neuromuscular re-education;Therapeutic exercise;Patient/family education;Moist Heat;Dry needling;Taping    PT Next Visit Plan UBE retro, strenghten cervical region (chin tucks progresison to quadruperd) core strengeth physiobal presdowns, quad birdog variations.    DC after next visit4/4?             Patient will benefit from skilled therapeutic intervention in order to improve the following deficits and impairments:  Impaired tone, Obesity, Hypomobility, Decreased mobility, Decreased endurance, Increased muscle spasms, Decreased range of motion, Decreased activity tolerance, Decreased strength, Postural dysfunction, Pain  Visit Diagnosis: Abnormal posture  Pain in thoracic spine  Cervicalgia     Problem List Patient Active Problem List   Diagnosis Date Noted   Neck and shoulder pain 05/14/2021   Large breasts 05/14/2021   Encounter for well woman exam with routine gynecological exam 05/14/2021   Irregular menstrual bleeding 05/14/2021   Symptomatic mammary hypertrophy 04/14/2021   Back pain 04/14/2021   Breast pain, left 05/13/2020   Encounter for gynecological examination with  Papanicolaou smear of cervix 05/13/2020   Multinodular goiter 02/06/2018   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 [place], [place], [address] Phone: [phone]   Fax:  [phone]  Name: Melanie Rose MRN: [id number] Date of Birth: 06-10-1981

## 2021-05-25 ENCOUNTER — Encounter: Payer: Self-pay | Admitting: Cardiology

## 2021-05-25 ENCOUNTER — Encounter: Payer: Self-pay | Admitting: Plastic Surgery

## 2021-05-25 NOTE — Progress Notes (Signed)
Cardiology Office Note   Date:  05/27/2021   ID:  Melanie Rose, DOB April 07, 1981, MRN [id number]  PCP:  Janora Norlander, DO  Cardiologist:   None Referring:  Wallace Going, DO  Chief Complaint  Patient presents with   Pre-op Exam       History of Present Illness: Melanie Rose is a 40 y.o. female who presents for evaluation preoperatively prior to breast reduction surgery.  She has no past cardiac history.  She has no significant cardiovascular risk factors.  She is active.  She exercises 6 days a week.  The patient denies any new symptoms such as chest discomfort, neck or arm discomfort. There has been no new shortness of breath, PND or orthopnea. There have been no reported palpitations, presyncope or syncope.   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   Vaginal Pap smear, abnormal     Past Surgical History:  Procedure Laterality Date   BREAST BIOPSY     negative   BREAST LUMPECTOMY WITH RADIOACTIVE SEED LOCALIZATION Left 03/07/2018   Procedure: LEFT BREAST LUMPECTOMY WITH RADIOACTIVE SEED LOCALIZATION;  Surgeon: Erroll Luna, MD;  Location: [facility];  Service: General;  Laterality: Left;   LAPAROSCOPIC TUBAL LIGATION Bilateral 01/16/2015   Procedure: LAPAROSCOPIC TUBAL LIGATION;  Surgeon: Newton Pigg, MD;  Location: [facility];  Service: Gynecology;  Laterality: Bilateral;   TUBAL LIGATION       Current Outpatient Medications  Medication Sig Dispense Refill   levothyroxine (SYNTHROID) 112 MCG tablet Take 1 tablet (112 mcg total) by mouth daily before breakfast. 30 tablet 3   predniSONE (DELTASONE) 20 MG tablet Take 3 tabs daily for 1 week, then 2 tabs daily for week  2, then 1 tab daily for week 3. 42 tablet 0   No current facility-administered medications for this visit.    Allergies:   Sulfa antibiotics    Social History:  The patient  reports that she quit smoking about 11 years ago. Her smoking use included cigarettes. She has a 10.00 pack-year smoking history. She has never used smokeless tobacco. She reports that she does not drink alcohol and does not use drugs.   Family History:  The patient's family history includes Cancer in her paternal grandfather; Heart disease in her maternal grandfather; Hypertension in her father and mother.    ROS:  Please see the history of present illness.   Otherwise, review of systems are positive for none.   All other systems are reviewed and negative.    PHYSICAL EXAM: VS:  BP 102/70   Pulse 74   Ht 5' (1.524 m)   Wt 171 lb (77.6 kg)   BMI 33.40 kg/m  , BMI Body mass index is 33.4 kg/m. GENERAL:  Well appearing HEENT:  Pupils equal round and reactive, fundi not visualized, oral mucosa unremarkable NECK:  No jugular venous distention, waveform within normal limits, carotid upstroke brisk and symmetric, no bruits, no thyromegaly LYMPHATICS:  No cervical, inguinal adenopathy LUNGS:  Clear to auscultation bilaterally BACK:  No CVA tenderness CHEST:  Unremarkable HEART:  PMI not displaced or sustained,S1 and S2  within normal limits, no S3, no S4, no clicks, no rubs, no murmurs ABD:  Flat, positive bowel sounds normal in frequency in pitch, no bruits, no rebound, no guarding, no midline pulsatile mass, no hepatomegaly, no splenomegaly EXT:  2 plus pulses throughout, no edema, no cyanosis no clubbing SKIN:  No rashes no nodules NEURO:  Cranial nerves II through XII grossly intact, motor grossly intact throughout PSYCH:  Cognitively intact, oriented to person place and time    EKG:  EKG is ordered today. The ekg ordered today demonstrates sinus rhythm, rate 74, axis within normal limits, intervals within  normal limits, no acute ST-T wave changes.  She does have low voltage on her chest and limb leads.   Recent Labs: 01/20/2021: ALT 23; BUN 10; Creatinine, Ser 0.97; Potassium 4.7; Sodium 138; TSH 2.190    Lipid Panel    Component Value Date/Time   CHOL 205 (H) 01/20/2021 1035   TRIG 101 01/20/2021 1035   HDL 77 01/20/2021 1035   CHOLHDL 2.7 01/20/2021 1035   LDLCALC 110 (H) 01/20/2021 1035      Wt Readings from Last 3 Encounters:  05/27/21 171 lb (77.6 kg)  05/14/21 171 lb 3.2 oz (77.7 kg)  05/08/21 173 lb (78.5 kg)      Other studies Reviewed: Additional studies/ records that were reviewed today include: Labs. Review of the above records demonstrates:  Please see elsewhere in the note.     ASSESSMENT AND PLAN:  PREOP: The patient has no high risk features or findings.  She is very active.  She is not going for high risk surgery.  No further cardiovascular testing is suggested.    OVERWEIGHT: The patient has done a wonderful job with lifestyle of exercise and diet and weight loss.  We talked about this and encouraged more the same with an emphasis on a Mediterranean plant-based diet.  ELEVATED LDL: I did review her lipids.  Her HDL is fantastic.  She has lost weight since her LDL was measured and there would be no indication for treatment.  ABNORMAL EKG: She does have some low voltage on her EKG without old EKGs for comparison but I would not suggest that this requires any follow-up or further testing.   Current medicines are reviewed at length with the patient today.  The patient does not have concerns regarding medicines.  The following changes have been made:  no change  Labs/ tests ordered today include: None  Orders Placed This Encounter  Procedures   EKG 12-Lead      Disposition:   FU with as needed.     Signed, Minus Breeding, MD  05/27/2021 2:15 PM    [facility]

## 2021-05-27 ENCOUNTER — Encounter: Payer: Self-pay | Admitting: Cardiology

## 2021-05-27 ENCOUNTER — Other Ambulatory Visit: Payer: Self-pay

## 2021-05-27 ENCOUNTER — Ambulatory Visit (INDEPENDENT_AMBULATORY_CARE_PROVIDER_SITE_OTHER): Payer: BC Managed Care – PPO | Admitting: Cardiology

## 2021-05-27 ENCOUNTER — Other Ambulatory Visit: Payer: Self-pay | Admitting: Family Medicine

## 2021-05-27 VITALS — BP 102/70 | HR 74 | Ht 60.0 in | Wt 171.0 lb

## 2021-05-27 DIAGNOSIS — Z01818 Encounter for other preprocedural examination: Secondary | ICD-10-CM

## 2021-05-27 DIAGNOSIS — E663 Overweight: Secondary | ICD-10-CM

## 2021-05-27 DIAGNOSIS — L139 Bullous disorder, unspecified: Secondary | ICD-10-CM

## 2021-05-27 NOTE — Patient Instructions (Signed)
Medication Instructions:  The current medical regimen is effective;  continue present plan and medications.  *If you need a refill on your cardiac medications before your next appointment, please call your pharmacy*  Follow-Up: At [facility], you and your health needs are our priority.  As part of our continuing mission to provide you with exceptional heart care, we have created designated Provider Care Teams.  These Care Teams include your primary Cardiologist (physician) and Advanced Practice Providers (APPs -  Physician Assistants and Nurse Practitioners) who all work together to provide you with the care you need, when you need it.  We recommend signing up for the patient portal called "MyChart".  Sign up information is provided on this After Visit Summary.  MyChart is used to connect with patients for Virtual Visits (Telemedicine).  Patients are able to view lab/test results, encounter notes, upcoming appointments, etc.  Non-urgent messages can be sent to your provider as well.   To learn more about what you can do with MyChart, go to [email or web address].    Follow up as needed with Dr Percival Spanish.  Thank you for choosing [facility]!!

## 2021-05-29 ENCOUNTER — Other Ambulatory Visit: Payer: Self-pay

## 2021-05-29 ENCOUNTER — Ambulatory Visit: Payer: BC Managed Care – PPO | Admitting: *Deleted

## 2021-05-29 DIAGNOSIS — M542 Cervicalgia: Secondary | ICD-10-CM

## 2021-05-29 DIAGNOSIS — R293 Abnormal posture: Secondary | ICD-10-CM

## 2021-05-29 DIAGNOSIS — M546 Pain in thoracic spine: Secondary | ICD-10-CM | POA: Diagnosis not present

## 2021-05-29 NOTE — Therapy (Signed)
[facility] [place], [place], [address] Phone: [phone]   Fax:  [phone]  Physical Therapy Treatment  Patient Details  Name: Melanie Rose MRN: [id number] Date of Birth: Jun 16, 1981 Referring Provider (PT): Audelia Hives, DO   Encounter Date: 05/29/2021   PT End of Session - 05/29/21 0909     Visit Number 4    Number of Visits 4    Date for PT Re-Evaluation 05/28/21    PT Start Time 0900    PT Stop Time 0935    PT Time Calculation (min) 35 min    Activity Tolerance Patient tolerated treatment well    Behavior During Therapy [facility] for tasks assessed/performed             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   Subjective Assessment - 05/29/21 0904     Subjective COVID-19 screening performed upon arrival.2/10 today. DC to HEP. Stopped Founder exercise due to H/A    How long can you sit comfortably? 30 mins but needs to change positions    How long can you walk comfortably? 45 mins    Patient Stated Goals decrease pain,  improve mvmnt, return to recreation and sport, stnad longer than 45 mins; sit longer than 60 min    Currently in Pain? Yes    Pain Score 2     Pain Location Neck    Pain Orientation Right    Pain Descriptors / Indicators Aching;Sore    Pain Type Chronic pain    Pain Onset More than a month ago                               [facility] Adult PT Treatment/Exercise - 05/29/21 0001       Exercises   Exercises Shoulder;Neck      Neck Exercises: Standing   Neck Retraction 10 reps;5 secs      Lumbar Exercises: Standing   Row Strengthening;20 reps   XTS blue 5 sec holds   Shoulder Extension Strengthening;15 reps   Blue XTS hold 5 secs     Shoulder Exercises: ROM/Strengthening   Other ROM/Strengthening Exercises Reviewed HEP, Handout given for chin tuck x 10 hold 3-5 secs      Shoulder Exercises: Isometric Strengthening   Other Isometric Exercises H-ABD with yellow Tband 2x10 with 3-5 sec holds  PT Short Term Goals - 04/30/21 1630       PT SHORT TERM GOAL #1   Title STGs = LTGs    Time 4    Period Weeks               PT Long Term Goals - 05/29/21 0910       PT LONG TERM GOAL #1   Title Patient to be independent with initial HEP    Baseline provided initial HEP at initial eval (TBA)    Time 4    Period Weeks    Status Achieved      PT LONG TERM GOAL #2   Title Patient will be able to improve bilateral cervical rotation to equal quality and distance without limitation.    Baseline Restricted L rotation by approx 15% comparaed to R    Time 4    Period Weeks    Status Achieved      PT LONG TERM GOAL #3   Title Patient will be able to reports increased ease of sitting greater than 60 mins with less than 2/10 discomfort    Baseline pain 4/10 with prolonged sitting    Time 4    Period Weeks    Status Not Met      PT LONG TERM GOAL #4   Title Patient will be able to discuss multidisciplinary approach to reach  end-goal of increased ease of running without pain.    Time 4    Status Achieved                   Plan - 05/29/21 0909     Clinical Impression Statement Pt arrived today doing fairly well with low pain levels 2/10. Rx focused on posture awareness and posterior chain  strengthening. Pt is independent with HEP and was able to meet LTGs except for pain sfter sitting prolonged periods.    Personal Factors and Comorbidities Comorbidity 1;Time since onset of injury/illness/exacerbation    Examination-Activity Limitations Lift;Other    Stability/Clinical Decision Making Stable/Uncomplicated    Rehab Potential Excellent    PT Frequency 1x / week    PT Duration 4 weeks    PT Treatment/Interventions ADLs/Self Care Home Management;Electrical Stimulation;Therapeutic activities;Neuromuscular re-education;Therapeutic exercise;Patient/family education;Moist Heat;Dry needling;Taping    PT Next Visit Plan DC to HEP    Consulted and Agree with Plan of Care Patient             Patient will benefit from skilled therapeutic intervention in order to improve the following deficits and impairments:  Impaired tone, Obesity, Hypomobility, Decreased mobility, Decreased endurance, Increased muscle spasms, Decreased range of motion, Decreased activity tolerance, Decreased strength, Postural dysfunction, Pain  Visit Diagnosis: Abnormal posture  Pain in thoracic spine  Cervicalgia     Problem List Patient Active Problem List   Diagnosis Date Noted   Neck and shoulder pain 05/14/2021   Large breasts 05/14/2021   Encounter for well woman exam with routine gynecological exam 05/14/2021   Irregular menstrual bleeding 05/14/2021   Symptomatic mammary hypertrophy 04/14/2021   Back pain 04/14/2021   Breast pain, left 05/13/2020   Encounter for gynecological examination with Papanicolaou smear of cervix 05/13/2020   Multinodular goiter 02/06/2018   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 [place], [place], [address] Phone: [phone]   Fax:  [phone]  Name: Melanie Rose MRN: [id number] Date of Birth: 1981/01/23

## 2021-06-04 ENCOUNTER — Encounter (HOSPITAL_COMMUNITY): Payer: BC Managed Care – PPO

## 2021-06-04 DIAGNOSIS — Z1231 Encounter for screening mammogram for malignant neoplasm of breast: Secondary | ICD-10-CM

## 2021-06-09 ENCOUNTER — Other Ambulatory Visit: Payer: Self-pay

## 2021-06-09 ENCOUNTER — Other Ambulatory Visit (HOSPITAL_COMMUNITY): Payer: Self-pay | Admitting: Adult Health

## 2021-06-09 ENCOUNTER — Ambulatory Visit (HOSPITAL_COMMUNITY)
Admission: RE | Admit: 2021-06-09 | Discharge: 2021-06-09 | Disposition: A | Discharge status: Home / Self Care | Payer: BC Managed Care – PPO | Source: Ambulatory Visit | Attending: Adult Health | Admitting: Adult Health

## 2021-06-09 ENCOUNTER — Encounter (HOSPITAL_COMMUNITY): Payer: Self-pay

## 2021-06-09 DIAGNOSIS — R928 Other abnormal and inconclusive findings on diagnostic imaging of breast: Secondary | ICD-10-CM

## 2021-06-09 DIAGNOSIS — R922 Inconclusive mammogram: Secondary | ICD-10-CM | POA: Diagnosis not present

## 2021-06-10 ENCOUNTER — Encounter: Payer: Self-pay | Admitting: Physician Assistant

## 2021-06-10 NOTE — Progress Notes (Signed)
Cardiac clearance received by her cardiologist.  No specific recommendations.  She is not high risk from cardiac standpoint.

## 2021-06-23 ENCOUNTER — Ambulatory Visit (HOSPITAL_COMMUNITY)
Admission: RE | Admit: 2021-06-23 | Discharge: 2021-06-23 | Disposition: A | Discharge status: Home / Self Care | Payer: BC Managed Care – PPO | Source: Ambulatory Visit | Attending: Adult Health | Admitting: Adult Health

## 2021-06-23 ENCOUNTER — Other Ambulatory Visit (HOSPITAL_COMMUNITY): Payer: Self-pay | Admitting: Adult Health

## 2021-06-23 ENCOUNTER — Encounter (HOSPITAL_COMMUNITY): Payer: Self-pay

## 2021-06-23 ENCOUNTER — Other Ambulatory Visit: Payer: Self-pay

## 2021-06-23 DIAGNOSIS — D241 Benign neoplasm of right breast: Secondary | ICD-10-CM | POA: Diagnosis not present

## 2021-06-23 DIAGNOSIS — R928 Other abnormal and inconclusive findings on diagnostic imaging of breast: Secondary | ICD-10-CM

## 2021-06-23 DIAGNOSIS — N6312 Unspecified lump in the right breast, upper inner quadrant: Secondary | ICD-10-CM | POA: Diagnosis not present

## 2021-06-23 MED ORDER — LIDOCAINE-EPINEPHRINE (PF) 1 %-1:200000 IJ SOLN
INTRAMUSCULAR | Status: AC
  Administered 2021-06-23: 10 mL
  Filled 2021-06-23: qty 30

## 2021-06-23 MED ORDER — LIDOCAINE HCL (PF) 2 % IJ SOLN
INTRAMUSCULAR | Status: AC
  Administered 2021-06-23: 10 mL
  Filled 2021-06-23: qty 10

## 2021-06-23 NOTE — Progress Notes (Signed)
PT tolerated right breast biopsy well today with NAD noted. PT verbalized understanding of discharge instructions. PT ambulated back to the mammogram area this time with ice packs given and labs sent for processing at this time by ultrasound staff.

## 2021-06-24 LAB — SURGICAL PATHOLOGY

## 2021-07-01 ENCOUNTER — Ambulatory Visit: Payer: BC Managed Care – PPO | Admitting: Cardiology

## 2021-07-06 NOTE — Progress Notes (Cosign Needed Addendum)
   Referring Provider Janora Norlander, DO [place],  [place] [id number]   CC:  Chief Complaint  Patient presents with   Follow-up      Melanie Rose is an 40 y.o. female.  HPI: Patient is a 40 y.o. year old female here for follow up after completing physical therapy for pain related to macromastia.   She was seen for initial consult 04/14/2021 by Dr. Marla Roe.  She complained of chronic back and neck discomfort and activities hindered by her enlarged breasts.  STN 39 cm bilaterally.  Preoperative bra size equals 36 I cup and she stated that she would like to be a B cup.  Estimated breast tissue to be removed at time of surgery equals 550 g bilaterally.  No family history of breast cancer.  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  History of hypertrophic scars.  Surgical clearance has already been received by her cardiologist.  Patient tells me that she has been doing self driven exercise and healthy eating, losing approximately 1 pound per week.  She endorses 10 pound weight loss since initial consult.  She tells me that despite the weight loss and physical therapy, she continues to endorse back and neck discomfort related to her macromastia.   Review of Systems General: No recent illness, infection, or changes in interim health.  Physical Exam Vitals with BMI 06/23/2021 05/27/2021 05/14/2021  Height - 5\' 0"  5\' 0"   Weight - 171 lbs 171 lbs 3 oz  BMI - 74.2 59.56  Systolic 387 564 332  Diastolic 77 70 76  Pulse 75 74 58    General:  No acute distress,  Alert and oriented, Non-Toxic, Normal speech and affect Psych: Normal behavior and mood MSK: Ambulatory.  Assessment/Plan  Patient is interested in pursuing surgical intervention for bilateral breast reduction. Patient has completed at least 4 weeks of physical therapy for pain related to macromastia.  Rather than completing a total of 6 weeks PT,  they advised her to hold off given lack of any significant improvement.  Discussed with patient we would submit to insurance for authorization, discussed approval could take up to 6 weeks.   Krista Blue 07/07/2021, 11:24 AM

## 2021-07-07 ENCOUNTER — Other Ambulatory Visit: Payer: Self-pay

## 2021-07-07 ENCOUNTER — Ambulatory Visit (INDEPENDENT_AMBULATORY_CARE_PROVIDER_SITE_OTHER): Payer: BC Managed Care – PPO | Admitting: Physician Assistant

## 2021-07-07 DIAGNOSIS — N62 Hypertrophy of breast: Secondary | ICD-10-CM

## 2021-07-20 ENCOUNTER — Encounter: Payer: Self-pay | Admitting: Family Medicine

## 2021-07-20 ENCOUNTER — Other Ambulatory Visit: Payer: Self-pay

## 2021-07-20 ENCOUNTER — Ambulatory Visit (INDEPENDENT_AMBULATORY_CARE_PROVIDER_SITE_OTHER): Payer: BC Managed Care – PPO | Admitting: Family Medicine

## 2021-07-20 VITALS — BP 101/64 | HR 93 | Temp 97.8°F | Ht 60.0 in | Wt 164.8 lb

## 2021-07-20 DIAGNOSIS — Z01818 Encounter for other preprocedural examination: Secondary | ICD-10-CM

## 2021-07-20 DIAGNOSIS — E890 Postprocedural hypothyroidism: Secondary | ICD-10-CM

## 2021-07-20 DIAGNOSIS — N62 Hypertrophy of breast: Secondary | ICD-10-CM | POA: Diagnosis not present

## 2021-07-20 DIAGNOSIS — M25512 Pain in left shoulder: Secondary | ICD-10-CM

## 2021-07-20 DIAGNOSIS — Z23 Encounter for immunization: Secondary | ICD-10-CM | POA: Diagnosis not present

## 2021-07-20 DIAGNOSIS — M25511 Pain in right shoulder: Secondary | ICD-10-CM

## 2021-07-20 DIAGNOSIS — E559 Vitamin D deficiency, unspecified: Secondary | ICD-10-CM

## 2021-07-20 DIAGNOSIS — M546 Pain in thoracic spine: Secondary | ICD-10-CM | POA: Diagnosis not present

## 2021-07-20 DIAGNOSIS — M542 Cervicalgia: Secondary | ICD-10-CM

## 2021-07-20 DIAGNOSIS — G8929 Other chronic pain: Secondary | ICD-10-CM

## 2021-07-20 NOTE — Progress Notes (Signed)
Pt is a 40 y.o. female who is here for preoperative clearance for breast reduction for symptomatic macromastia.  She reports feeling well.  She reports he has had some increased neck and upper back pain despite weight loss.  She is currently waiting for insurance clearance for her breast reduction.  She has an appointment with her endocrinologist soon for checkup.  Denies any difficulty swallowing, change in voice, tremor, heart palpitations or change in bowel habits.  She is compliant with her Synthroid.  1) High Risk Cardiac Conditions  1) Recent MI - No.  2) Decompensated Heart Failure - No.  3) Unstable angina - No.  4) Symptomatic arrythmia - No.  5) Sx Valvular Disease - No.  2) Intermediate Risk Factors - DM, CKD, CVA, CHF, CAD - No.  2) Functional Status - > 4 mets (Walk, run, climb stairs) Yes.  .    3) Surgery Specific Risk - Low ( Breast )  4) Further Noninvasive evaluation -   1) EKG - No.   1) Hx of CVA, CAD, DM, CKD  2) Echo - No.   1) Worsening dyspnea   3) Stress Testing - Active Cardiac Disease - No.  5) Need for medical therapy - Beta Blocker, Statins indicated ? No.  PE: Vitals:   07/20/21 0802  BP: 101/64  Pulse: 93  Temp: 97.8 F (36.6 C)   Physical Examination:  General appearance - alert, well appearing, and in no distress and well hydrated HEENT: Mallampati 3, MMM Mental status - alert, oriented to person, place, and time Eyes - pupils equal and reactive, extraocular eye movements intact Neck - supple, no masses Chest - clear to auscultation, no wheezes, rales or rhonchi, symmetric air entry Heart - normal rate, regular rhythm, normal S1, S2, no murmurs, rubs, clicks or gallops  Preop examination - Plan: CMP14+EGFR, CBC, Protime-INR  Macromastia - Plan: CMP14+EGFR, CBC, Protime-INR  Chronic bilateral thoracic back pain  Chronic pain of both shoulders  Chronic neck pain  Postablative hypothyroidism - Plan: TSH, T4, Free  Vitamin D deficiency  - Plan: VITAMIN D 25 Hydroxy (Vit-D Deficiency, Fractures)  Symptomatic macromastia.  Plan for breast reduction in the next few months. Insurance still pending. Chronic shoulder/ back/ neck pain unfortunately worsening despite weight loss.  Hope that she can get what she needs soon.  Will be glad to clear once she is ready to be scheduled for surgery.  Has already had cardiac clearance so see no barriers to operation at this time.  I have independently evaluated patient.  Melanie Rose is a 40 y.o. female who is LOW risk for a LOW risk surgery.  There are not modifiable risk factors (smoking, etc). Maham C Rafferty's RCRI/NSQIP calculation for MACE is: 0.3%.    TSH and Ft4 obtained.  Will cc labs to Dr Lambeth once available.  Ashly M. Gottschalk, DO [facility]  

## 2021-07-21 LAB — CMP14+EGFR
ALT: 10 IU/L (ref 0–32)
AST: 19 IU/L (ref 0–40)
Albumin/Globulin Ratio: 1.5 (ref 1.2–2.2)
Albumin: 4.3 g/dL (ref 3.8–4.8)
Alkaline Phosphatase: 88 IU/L (ref 44–121)
BUN/Creatinine Ratio: 13 (ref 9–23)
BUN: 14 mg/dL (ref 6–20)
Bilirubin Total: 0.4 mg/dL (ref 0.0–1.2)
CO2: 22 mmol/L (ref 20–29)
Calcium: 9.7 mg/dL (ref 8.7–10.2)
Chloride: 103 mmol/L (ref 96–106)
Creatinine, Ser: 1.07 mg/dL — ABNORMAL HIGH (ref 0.57–1.00)
Globulin, Total: 2.9 g/dL (ref 1.5–4.5)
Glucose: 92 mg/dL (ref 70–99)
Potassium: 4.8 mmol/L (ref 3.5–5.2)
Sodium: 137 mmol/L (ref 134–144)
Total Protein: 7.2 g/dL (ref 6.0–8.5)
eGFR: 69 mL/min/{1.73_m2} (ref >59)

## 2021-07-21 LAB — PROTIME-INR
INR: 1 (ref 0.9–1.2)
Prothrombin Time: 10.3 s (ref 9.1–12.0)

## 2021-07-21 LAB — CBC
Hematocrit: 38.2 % (ref 34.0–46.6)
Hemoglobin: 12.3 g/dL (ref 11.1–15.9)
MCH: 27.8 pg (ref 26.6–33.0)
MCHC: 32.2 g/dL (ref 31.5–35.7)
MCV: 86 fL (ref 79–97)
Platelets: 265 10*3/uL (ref 150–450)
RBC: 4.43 x10E6/uL (ref 3.77–5.28)
RDW: 13.2 % (ref 11.7–15.4)
WBC: 5.8 10*3/uL (ref 3.4–10.8)

## 2021-07-21 LAB — T4, FREE: Free T4: 1.47 ng/dL (ref 0.82–1.77)

## 2021-07-21 LAB — TSH: TSH: 2.6 u[IU]/mL (ref 0.450–4.500)

## 2021-07-21 LAB — VITAMIN D 25 HYDROXY (VIT D DEFICIENCY, FRACTURES): Vit D, 25-Hydroxy: 29.1 ng/mL — ABNORMAL LOW (ref 30.0–100.0)

## 2021-09-15 ENCOUNTER — Encounter: Payer: Self-pay | Admitting: Surgical

## 2021-09-15 ENCOUNTER — Ambulatory Visit (INDEPENDENT_AMBULATORY_CARE_PROVIDER_SITE_OTHER): Payer: BC Managed Care – PPO | Admitting: Surgical

## 2021-09-15 ENCOUNTER — Other Ambulatory Visit: Payer: Self-pay

## 2021-09-15 VITALS — BP 108/69 | HR 76 | Ht 60.0 in | Wt 163.2 lb

## 2021-09-15 DIAGNOSIS — N62 Hypertrophy of breast: Secondary | ICD-10-CM

## 2021-09-15 DIAGNOSIS — M546 Pain in thoracic spine: Secondary | ICD-10-CM

## 2021-09-15 DIAGNOSIS — G8929 Other chronic pain: Secondary | ICD-10-CM

## 2021-09-15 MED ORDER — ONDANSETRON HCL 4 MG PO TABS: 4.0000 mg | ORAL_TABLET | Freq: Three times a day (TID) | ORAL | 0 refills | Status: DC | PRN

## 2021-09-15 MED ORDER — HYDROCODONE-ACETAMINOPHEN 5-325 MG PO TABS: 1.0000 | ORAL_TABLET | Freq: Four times a day (QID) | ORAL | 0 refills | Status: AC | PRN

## 2021-09-15 MED ORDER — CEPHALEXIN 500 MG PO CAPS: 500.0000 mg | ORAL_CAPSULE | Freq: Four times a day (QID) | ORAL | 0 refills | Status: AC

## 2021-09-15 NOTE — Progress Notes (Signed)
Patient ID: Melanie Rose, female    DOB: [birth year], 41 y.o.   MRN: [id number]  Chief Complaint  Patient presents with   Pre-op Exam      ICD-10-CM   1. Symptomatic mammary hypertrophy  N62     2. Chronic bilateral thoracic back pain  M54.6    G89.29       History of Present Illness: Melanie Rose is a 41 y.o.  female  with a history of macromastia.  She presents for preoperative evaluation for upcoming procedure, Bilateral Breast Reduction with possible liposuction, scheduled for 10/08/2021 with Dr.  Marla Roe.  She presents today with her husband  The patient has not had problems with anesthesia. No history of DVT/PE.  No family history of DVT/PE.  No family or personal history of bleeding or clotting disorders.  Patient is not currently taking any blood thinners.  No history of CVA/MI.   Summary of Previous Visit: STN 39 cm bilaterally.  Preoperative bra size is 36 I cup.  Patient would like to be a B cup.  No family history of breast cancer.  Patient had mammogram on 06/09/2021 with BI-RADS Category 4 suspicious with right breast biopsy showing fibroadenoma.  She has a history of hypertrophic scarring.  Estimated excess breast tissue to be removed at time of surgery: 550 grams  PMH Significant for: Thyroid disorder, GERD, fibromyalgia  Patient saw her PCP for surgical clearance/preoperative examination prior to surgery, patient is low risk per PCP.  Appreciate their assistance.  Patient also saw cardiology for preoperative clearance.  No high risk features or findings.  No further cardiovascular testing necessary.  Patient reports has been feeling well lately.  No recent changes in her health.  Past Medical History: Allergies: Allergies  Allergen Reactions   Sulfa Antibiotics Rash    Current Medications:  Current Outpatient Medications:    cephALEXin (KEFLEX) 500 MG capsule, Take 1 capsule (500 mg total) by mouth 4 (four) times daily for 3 days., Disp: 12 capsule, Rfl:  0   HYDROcodone-acetaminophen (NORCO) 5-325 MG tablet, Take 1 tablet by mouth every 6 (six) hours as needed for up to 5 days for severe pain., Disp: 20 tablet, Rfl: 0   levothyroxine (SYNTHROID) 112 MCG tablet, Take 1 tablet (112 mcg total) by mouth daily before breakfast., Disp: 30 tablet, Rfl: 3   ondansetron (ZOFRAN) 4 MG tablet, Take 1 tablet (4 mg total) by mouth every 8 (eight) hours as needed for nausea or vomiting., Disp: 20 tablet, Rfl: 0  Past Medical Problems: 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   Vaginal Pap smear, abnormal     Past Surgical History: Past Surgical History:  Procedure Laterality Date   BREAST BIOPSY     negative   BREAST LUMPECTOMY WITH RADIOACTIVE SEED LOCALIZATION Left 03/07/2018   Procedure: LEFT BREAST LUMPECTOMY WITH RADIOACTIVE SEED LOCALIZATION;  Surgeon: Erroll Luna, MD;  Location: [facility];  Service: General;  Laterality: Left;   LAPAROSCOPIC TUBAL LIGATION Bilateral 01/16/2015   Procedure: LAPAROSCOPIC TUBAL LIGATION;  Surgeon: Newton Pigg, MD;  Location: [facility];  Service: Gynecology;  Laterality: Bilateral;  TUBAL LIGATION      Social History: Social History   Socioeconomic History   Marital status: Married    Spouse name: Not on file   Number of children: Not on file   Years of education: Not on file   Highest education level: Not on file  Occupational History   Not on file  Tobacco Use   Smoking status: Former    Packs/day: 1.00    Years: 10.00    Pack years: 10.00    Types: Cigarettes    Quit date: 06/22/2009    Years since quitting: 12.2   Smokeless tobacco: Never  Vaping Use   Vaping Use: Never used  Substance and Sexual  Activity   Alcohol use: No   Drug use: No   Sexual activity: Yes    Birth control/protection: Surgical    Comment: tubal  Other Topics Concern   Not on file  Social History Narrative   Ms Gordy is a pleasant female who is married and has 2 children.     Social Determinants of Health   Financial Resource Strain: Low Risk    Difficulty of Paying Living Expenses: Not hard at all  Food Insecurity: No Food Insecurity   Worried About Charity fundraiser in the Last Year: Never true   [place] in the Last Year: Never true  Transportation Needs: No Transportation Needs   Lack of Transportation (Medical): No   Lack of Transportation (Non-Medical): No  Physical Activity: Sufficiently Active   Days of Exercise per Week: 5 days   Minutes of Exercise per Session: 50 min  Stress: No Stress Concern Present   Feeling of Stress : Not at all  Social Connections: Moderately Integrated   Frequency of Communication with Friends and Family: Twice a week   Frequency of Social Gatherings with Friends and Family: Once a week   Attends Religious Services: Never   Marine scientist or Organizations: Yes   Attends Archivist Meetings: Never   Marital Status: Married  Human resources officer Violence: Not At Risk   Fear of Current or Ex-Partner: No   Emotionally Abused: No   Physically Abused: No   Sexually Abused: No    Family History: Family History  Problem Relation Age of Onset   Hypertension Mother    Hypertension Father    Heart disease Maternal Grandfather    Cancer Paternal Grandfather     Review of Systems: Review of Systems  Constitutional: Negative.   Respiratory: Negative.    Cardiovascular: Negative.   Musculoskeletal:  Positive for back pain, myalgias and neck pain.  Neurological: Negative.    Physical Exam: Vital Signs BP 108/69 (BP Location: Left Arm, Patient Position: Sitting, Cuff Size: Large)    Pulse 76    Ht 5' (1.524 m)    Wt 163 lb 3.2 oz (74 kg)     SpO2 100%    BMI 31.87 kg/m   Physical Exam Constitutional:      General: Not in acute distress.    Appearance: Normal appearance. Not ill-appearing.  HENT:     Head: Normocephalic and atraumatic.  Eyes:     Pupils: Pupils are equal, round Neck:     Musculoskeletal: Normal range of motion.  Cardiovascular:     Rate and Rhythm: Normal rate    Pulses: Normal pulses.  Pulmonary:     Effort: Pulmonary effort is normal. No respiratory distress.  Musculoskeletal: Normal range of motion.  Skin:  General: Skin is warm and dry.     Findings: No erythema or rash.  Neurological:     General: No focal deficit present.     Mental Status: Alert and oriented to person, place, and time. Mental status is at baseline.     Motor: No weakness.  Psychiatric:        Mood and Affect: Mood normal.        Behavior: Behavior normal.    Assessment/Plan: The patient is scheduled for bilateral breast reduction with possible liposuction with Dr. Marla Roe.  Risks, benefits, and alternatives of procedure discussed, questions answered and consent obtained.    Smoking Status: Quit 12 years ago Last Mammogram: 06/23/2021 with subsequent ultrasound and biopsy of right breast showing fibroadenoma.  No other concerns  Caprini Score: 3, moderate; Risk Factors include: Age, BMI greater than 25, and length of planned surgery. Recommendation for mechanical prophylaxis. Encourage early ambulation.   Pictures obtained: @consult   Post-op Rx sent to pharmacy: Norco, Zofran, Keflex  Patient was provided with the breast reduction and General Surgical Risk consent document and Pain Medication Agreement prior to their appointment.  They had adequate time to read through the risk consent documents and Pain Medication Agreement. We also discussed them in person together during this preop appointment. All of their questions were answered to their satisfaction.  Recommended calling if they have any further questions.  Risk  consent form and Pain Medication Agreement to be scanned into patient's chart.  The risk that can be encountered with breast reduction were discussed and include the following but not limited to these:  Breast asymmetry, fluid accumulation, firmness of the breast, inability to breast feed, loss of nipple or areola, skin loss, decrease or no nipple sensation, fat necrosis of the breast tissue, bleeding, infection, healing delay.  There are risks of anesthesia, changes to skin sensation and injury to nerves or blood vessels.  The muscle can be temporarily or permanently injured.  You may have an allergic reaction to tape, suture, glue, blood products which can result in skin discoloration, swelling, pain, skin lesions, poor healing.  Any of these can lead to the need for revisonal surgery or stage procedures.  A reduction has potential to interfere with diagnostic procedures.  Nipple or breast piercing can increase risks of infection.  This procedure is best done when the breast is fully developed.  Changes in the breast will continue to occur over time.  Pregnancy can alter the outcomes of previous breast reduction surgery, weight gain and weigh loss can also effect the long term appearance.   The risks that can be encountered with and after liposuction were discussed and include the following but no limited to these:  Asymmetry, fluid accumulation, firmness of the area, fat necrosis with death of fat tissue, bleeding, infection, delayed healing, anesthesia risks, skin sensation changes, injury to structures including nerves, blood vessels, and muscles which may be temporary or permanent, allergies to tape, suture materials and glues, blood products, topical preparations or injected agents, skin and contour irregularities, skin discoloration and swelling, deep vein thrombosis, cardiac and pulmonary complications, pain, which may persist, persistent pain, recurrence of the lesion, poor healing of the incision,  possible need for revisional surgery or staged procedures. Thiere can also be persistent swelling, poor wound healing, rippling or loose skin, worsening of cellulite, swelling, and thermal burn or heat injury from ultrasound with the ultrasound-assisted lipoplasty technique. Any change in weight fluctuations can alter the outcome.  We discussed that  due to their current size that a postoperative cup size of be would not be possible.  Patient was understanding of this and we discussed the reasoning for this including the pedicle needing adequate blood supply to supply the NAC.  Patient and her husband were both understanding of this.  She reported that she would like to be as small as safely possible.  After discussing nipple areolar complex grafting/free nipple graft technique she is aware that this is a possibility if intraoperatively there is concerns for blood supply to her NAC.  Patient was understanding of this and stated that she would be comfortable with free nipple graft technique if it was necessary intraoperatively due to concern for blood supply.   Electronically signed by: Carola Rhine Krisi Azua, PA-C 09/15/2021 2:07 PM

## 2021-09-29 ENCOUNTER — Encounter (HOSPITAL_BASED_OUTPATIENT_CLINIC_OR_DEPARTMENT_OTHER): Payer: Self-pay | Admitting: Plastic Surgery

## 2021-09-29 ENCOUNTER — Other Ambulatory Visit: Payer: Self-pay

## 2021-10-02 ENCOUNTER — Encounter: Payer: Self-pay | Admitting: Plastic Surgery

## 2021-10-02 NOTE — Telephone Encounter (Signed)
Melanie Rose has already contacted patient.

## 2021-10-04 ENCOUNTER — Encounter: Payer: Self-pay | Admitting: Plastic Surgery

## 2021-10-04 ENCOUNTER — Telehealth: Payer: BC Managed Care – PPO | Admitting: Nurse Practitioner

## 2021-10-04 DIAGNOSIS — U071 COVID-19: Secondary | ICD-10-CM | POA: Diagnosis not present

## 2021-10-04 MED ORDER — MOLNUPIRAVIR EUA 200MG CAPSULE
4.0000 | ORAL_CAPSULE | Freq: Two times a day (BID) | ORAL | 0 refills | Status: AC
Start: 2021-10-04 — End: 2021-10-09

## 2021-10-04 NOTE — Patient Instructions (Signed)
Melanie Rose, thank you for joining Gildardo Pounds, NP for today's virtual visit.  While this provider is not your primary care provider (PCP), if your PCP is located in our provider database this encounter information will be shared with them immediately following your visit.  Consent: (Patient) Melanie Rose provided verbal consent for this virtual visit at the beginning of the encounter.  Current Medications:  Current Outpatient Medications:    levothyroxine (SYNTHROID) 112 MCG tablet, Take 1 tablet (112 mcg total) by mouth daily before breakfast., Disp: 30 tablet, Rfl: 3   molnupiravir EUA (LAGEVRIO) 200 mg CAPS capsule, Take 4 capsules (800 mg total) by mouth 2 (two) times daily for 5 days., Disp: 40 capsule, Rfl: 0   ondansetron (ZOFRAN) 4 MG tablet, Take 1 tablet (4 mg total) by mouth every 8 (eight) hours as needed for nausea or vomiting., Disp: 20 tablet, Rfl: 0   Medications ordered in this encounter:  Meds ordered this encounter  Medications   molnupiravir EUA (LAGEVRIO) 200 mg CAPS capsule    Sig: Take 4 capsules (800 mg total) by mouth 2 (two) times daily for 5 days.    Dispense:  40 capsule    Refill:  0    Order Specific Question:   Supervising Provider    Answer:   Sabra Heck, [place]     *If you need refills on other medications prior to your next appointment, please contact your pharmacy*  Follow-Up: Call back or seek an in-person evaluation if the symptoms worsen or if the condition fails to improve as anticipated.  Other Instructions Please keep well-hydrated and get plenty of rest. Start a saline nasal rinse to flush out your nasal passages. You can use plain Mucinex to help thin congestion. If you have a humidifier, running in the bedroom at night. I want you to start OTC vitamin D3 1000 units daily, vitamin C 1000 mg daily, and a zinc supplement. Please take prescribed medications as directed.   You have been enrolled in a MyChart symptom monitoring  program. Please answer these questions daily so we can keep track of how you are doing.   If PCR test positive, please see quarantine instructions below. I also want you to message me via MyChart, using the separate message I have sent you. This way you can communicate directly with me.    You were to quarantine for 5 days from onset of your symptoms.  After day 5, if you have had no fever and you are feeling better, you can end quarantine but need to mask for an additional 5 days. After day 5 if you have a fever or are having significant symptoms, please quarantine for full 10 days.   If you note any worsening of symptoms, any significant shortness of breath or any chest pain, please seek ER evaluation ASAP.  Please do not delay care!    If you have been instructed to have an in-person evaluation today at a local Urgent Care facility, please use the link below. It will take you to a list of all of our available [facility], including address, phone number and hours of operation. Please do not delay care.  [facility]  If you or a family member do not have a primary care provider, use the link below to schedule a visit and establish care. When you choose a [facility] primary care physician or advanced practice provider, you gain a long-term partner in health. Find  a Primary Care Provider  Learn more about [facility] in-office and virtual care options: [facility]

## 2021-10-04 NOTE — Progress Notes (Signed)
Virtual Visit Consent   Melanie Rose, you are scheduled for a virtual visit with a [facility] provider today.     Just as with appointments in the office, your consent must be obtained to participate.  Your consent will be active for this visit and any virtual visit you may have with one of our providers in the next 365 days.     If you have a MyChart account, a copy of this consent can be sent to you electronically.  All virtual visits are billed to your insurance company just like a traditional visit in the office.    As this is a virtual visit, video technology does not allow for your provider to perform a traditional examination.  This may limit your provider's ability to fully assess your condition.  If your provider identifies any concerns that need to be evaluated in person or the need to arrange testing (such as labs, EKG, etc.), we will make arrangements to do so.     Although advances in technology are sophisticated, we cannot ensure that it will always work on either your end or our end.  If the connection with a video visit is poor, the visit may have to be switched to a telephone visit.  With either a video or telephone visit, we are not always able to ensure that we have a secure connection.     I need to obtain your verbal consent now.   Are you willing to proceed with your visit today?    Melanie Rose has provided verbal consent on 10/04/2021 for a virtual visit (video or telephone).   Gildardo Pounds, NP   Date: 10/04/2021 9:39 AM   Virtual Visit via Video Note   I, Gildardo Pounds, connected with  Melanie Rose  ([id number], [birth year]) on 10/04/21 at  9:30 AM EST by a video-enabled telemedicine application and verified that I am speaking with the correct person using two identifiers.  Location: Patient: Virtual Visit Location Patient: Home Provider: Virtual Visit Location Provider: Home Office   I discussed the limitations of evaluation and management by telemedicine  and the availability of in person appointments. The patient expressed understanding and agreed to proceed.    History of Present Illness: Melanie Rose is a 41 y.o. who identifies as a female who was assigned female at birth, and is being seen today for COVID 5.  Tested positive for COVID this morning. Onset of symptoms started yesterday which include: Headaches, myalgias, sinus pressure and dizziness.   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   Vitamin D deficiency 02/06/2018   Cervical high risk HPV (human papillomavirus) test positive 03/08/2016   Obesity (BMI 30-39.9) 07/10/2015   Fibromyalgia 04/24/2014   Positive ANA (antinuclear antibody) 04/10/2014   Diffuse connective tissue disease ([place]) 04/10/2014    Allergies:  Allergies  Allergen Reactions   Sulfa Antibiotics Rash   Medications:  Current Outpatient Medications:    molnupiravir EUA (LAGEVRIO) 200 mg CAPS capsule, Take 4 capsules (800 mg total) by mouth 2 (two) times daily for 5 days., Disp: 40 capsule, Rfl: 0   levothyroxine (SYNTHROID) 112 MCG tablet, Take 1 tablet (112 mcg total) by mouth  daily before breakfast., Disp: 30 tablet, Rfl: 3   ondansetron (ZOFRAN) 4 MG tablet, Take 1 tablet (4 mg total) by mouth every 8 (eight) hours as needed for nausea or vomiting., Disp: 20 tablet, Rfl: 0  Observations/Objective: Patient is well-developed, well-nourished in no acute distress.  Resting comfortably at home.  Head is normocephalic, atraumatic.  No labored breathing.  Speech is clear and coherent with logical content.  Patient is alert and oriented at baseline.    Assessment and Plan: 1. Positive self-administered  antigen test for COVID-19 - molnupiravir EUA (LAGEVRIO) 200 mg CAPS capsule; Take 4 capsules (800 mg total) by mouth 2 (two) times daily for 5 days.  Dispense: 40 capsule; Refill: 0 Please keep well-hydrated and get plenty of rest. Start a saline nasal rinse to flush out your nasal passages. You can use plain Mucinex to help thin congestion. If you have a humidifier, running in the bedroom at night. I want you to start OTC vitamin D3 1000 units daily, vitamin C 1000 mg daily, and a zinc supplement. Please take prescribed medications as directed.   You have been enrolled in a MyChart symptom monitoring program. Please answer these questions daily so we can keep track of how you are doing.   If PCR test positive, please see quarantine instructions below. I also want you to message me via MyChart, using the separate message I have sent you. This way you can communicate directly with me.    You were to quarantine for 5 days from onset of your symptoms.  After day 5, if you have had no fever and you are feeling better, you can end quarantine but need to mask for an additional 5 days. After day 5 if you have a fever or are having significant symptoms, please quarantine for full 10 days.   If you note any worsening of symptoms, any significant shortness of breath or any chest pain, please seek ER evaluation ASAP.  Please do not delay care!   Follow Up Instructions: I discussed the assessment and treatment plan with the patient. The patient was provided an opportunity to ask questions and all were answered. The patient agreed with the plan and demonstrated an understanding of the instructions.  A copy of instructions were sent to the patient via MyChart unless otherwise noted below.    The patient was advised to call back or seek an in-person evaluation if the symptoms worsen or if the condition fails to improve as anticipated.  Time:  I spent 12 minutes with the patient via telehealth technology  discussing the above problems/concerns.    Gildardo Pounds, NP

## 2021-10-06 ENCOUNTER — Telehealth: Payer: BC Managed Care – PPO | Admitting: Nurse Practitioner

## 2021-10-06 DIAGNOSIS — J029 Acute pharyngitis, unspecified: Secondary | ICD-10-CM

## 2021-10-06 NOTE — Progress Notes (Signed)
E-Visit for Sore Throat  We are sorry that you are not feeling well.  Here is how we plan to help!  Unfortunately this sore throat just goes along with covid and all you can do is treat symptoms. See below.  Your symptoms indicate a likely viral infection (Pharyngitis).   Pharyngitis is inflammation in the back of the throat which can cause a sore throat, scratchiness and sometimes difficulty swallowing.   Pharyngitis is typically caused by a respiratory virus and will just run its course.  Please keep in mind that your symptoms could last up to 10 days.  For throat pain, we recommend over the counter oral pain relief medications such as acetaminophen or aspirin, or anti-inflammatory medications such as ibuprofen or naproxen sodium.  Topical treatments such as oral throat lozenges or sprays may be used as needed.  Avoid close contact with loved ones, especially the very young and elderly.  Remember to wash your hands thoroughly throughout the day as this is the number one way to prevent the spread of infection and wipe down door knobs and counters with disinfectant.  After careful review of your answers, I would not recommend an antibiotic for your condition.  Antibiotics should not be used to treat conditions that we suspect are caused by viruses like the virus that causes the common cold or flu. However, some people can have Strep with atypical symptoms. You may need formal testing in clinic or office to confirm if your symptoms continue or worsen.  Providers prescribe antibiotics to treat infections caused by bacteria. Antibiotics are very powerful in treating bacterial infections when they are used properly.  To maintain their effectiveness, they should be used only when necessary.  Overuse of antibiotics has resulted in the development of super bugs that are resistant to treatment!    Home Care: Only take medications as instructed by your medical team. Do not drink alcohol while taking these  medications. A steam or ultrasonic humidifier can help congestion.  You can place a towel over your head and breathe in the steam from hot water coming from a faucet. Avoid close contacts especially the very young and the elderly. Cover your mouth when you cough or sneeze. Always remember to wash your hands.  Get Help Right Away If: You develop worsening fever or throat pain. You develop a severe head ache or visual changes. Your symptoms persist after you have completed your treatment plan.  Make sure you Understand these instructions. Will watch your condition. Will get help right away if you are not doing well or get worse.   Thank you for choosing an e-visit.  Your e-visit answers were reviewed by a board certified advanced clinical practitioner to complete your personal care plan. Depending upon the condition, your plan could have included both over the counter or prescription medications.  Please review your pharmacy choice. Make sure the pharmacy is open so you can pick up prescription now. If there is a problem, you may contact your provider through [employer] and have the prescription routed to another pharmacy.  Your safety is important to [place]. If you have drug allergies check your prescription carefully.   For the next 24 hours you can use MyChart to ask questions about today's visit, request a non-urgent call back, or ask for a work or school excuse. You will get an email in the next two days asking about your experience. I hope that your e-visit has been valuable and will speed your recovery.  5-10 minutes spent reviewing and documenting in chart.

## 2021-10-19 ENCOUNTER — Other Ambulatory Visit: Payer: Self-pay

## 2021-10-19 ENCOUNTER — Ambulatory Visit (INDEPENDENT_AMBULATORY_CARE_PROVIDER_SITE_OTHER): Payer: BC Managed Care – PPO | Admitting: Surgical

## 2021-10-19 ENCOUNTER — Encounter: Payer: Self-pay | Admitting: Surgical

## 2021-10-19 VITALS — BP 105/72 | HR 72 | Ht 60.0 in | Wt 160.0 lb

## 2021-10-19 DIAGNOSIS — M546 Pain in thoracic spine: Secondary | ICD-10-CM

## 2021-10-19 DIAGNOSIS — G8929 Other chronic pain: Secondary | ICD-10-CM

## 2021-10-19 DIAGNOSIS — N62 Hypertrophy of breast: Secondary | ICD-10-CM

## 2021-10-19 NOTE — H&P (View-Only) (Signed)
Patient ID: Melanie Rose, female    DOB: October 26, 1980, 41 y.o.   MRN: [id number]  Chief Complaint  Patient presents with   Pre-op Exam      ICD-10-CM   1. Symptomatic mammary hypertrophy  N62     2. Chronic bilateral thoracic back pain  M54.6    G89.29       History of Present Illness: Melanie Rose is a 41 y.o.  female  with a history of macromastia.  She presents for preoperative evaluation for upcoming procedure, Bilateral Breast Reduction, scheduled for 11/05/2021 with Dr.  Marla Roe  The patient has not had problems with anesthesia. No history of DVT/PE.  No family history of DVT/PE.  No family or personal history of bleeding or clotting disorders.  Patient is not currently taking any blood thinners.  No history of CVA/MI.   Summary of Previous Visit: STN 39 cm bilaterally, preoperative bra size is a 36 I cup.  She would like to be about a B cup.  No family history of breast cancer.  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  She has a history of hypertrophic scarring.  Estimated excess breast tissue to be removed at time of surgery: 550 grams  PMH Significant for: Thyroid disorder, GERD, fibromyalgia  Patient has seen her PCP and cardiology for preoperative clearance previously within the last 6 months. Per PCP patient is low risk.  Per cardiology no high risk features or findings, no further cardiovascular testing was necessary.  Patient had previous preoperative appointment with me on 09/15/2021, however surgery was canceled due to patient testing positive for COVID-19.  She reports she has been feeling well since then, no recent changes in her health otherwise.  She is ready for surgery.  She is here today with her husband.  Past Medical History: Allergies: Allergies  Allergen Reactions   Sulfa Antibiotics Rash    Current Medications:  Current Outpatient Medications:    levothyroxine (SYNTHROID) 112 MCG tablet, Take 1 tablet (112  mcg total) by mouth daily before breakfast., Disp: 30 tablet, Rfl: 3   ondansetron (ZOFRAN) 4 MG tablet, Take 1 tablet (4 mg total) by mouth every 8 (eight) hours as needed for nausea or vomiting., Disp: 20 tablet, Rfl: 0  Past Medical Problems: 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   Vaginal Pap smear, abnormal     Past Surgical History: Past Surgical History:  Procedure Laterality Date   BREAST BIOPSY     negative   BREAST LUMPECTOMY WITH RADIOACTIVE SEED LOCALIZATION Left 03/07/2018   Procedure: LEFT BREAST LUMPECTOMY WITH RADIOACTIVE SEED LOCALIZATION;  Surgeon: Erroll Luna, MD;  Location: [facility];  Service: General;  Laterality: Left;   LAPAROSCOPIC TUBAL LIGATION Bilateral 01/16/2015   Procedure: LAPAROSCOPIC TUBAL LIGATION;  Surgeon: Newton Pigg, MD;  Location: [facility];  Service: Gynecology;  Laterality: Bilateral;   TUBAL LIGATION      Social History: Social History   Socioeconomic History   Marital status: Married    Spouse name: Not on file   Number of children: Not on file  Years of education: Not on file   Highest education level: Not on file  Occupational History   Not on file  Tobacco Use   Smoking status: Former    Packs/day: 1.00    Years: 10.00    Pack years: 10.00    Types: Cigarettes    Quit date: 06/22/2009    Years since quitting: 12.3   Smokeless tobacco: Never  Vaping Use   Vaping Use: Never used  Substance and Sexual Activity   Alcohol use: No   Drug use: No   Sexual activity: Yes    Birth control/protection: Surgical    Comment: tubal  Other Topics Concern   Not on file  Social History Narrative   Melanie Rose is a pleasant female who is  married and has 2 children.     Social Determinants of Health   Financial Resource Strain: Low Risk    Difficulty of Paying Living Expenses: Not hard at all  Food Insecurity: No Food Insecurity   Worried About Charity fundraiser in the Last Year: Never true   [place] in the Last Year: Never true  Transportation Needs: No Transportation Needs   Lack of Transportation (Medical): No   Lack of Transportation (Non-Medical): No  Physical Activity: Sufficiently Active   Days of Exercise per Week: 5 days   Minutes of Exercise per Session: 50 min  Stress: No Stress Concern Present   Feeling of Stress : Not at all  Social Connections: Moderately Integrated   Frequency of Communication with Friends and Family: Twice a week   Frequency of Social Gatherings with Friends and Family: Once a week   Attends Religious Services: Never   Marine scientist or Organizations: Yes   Attends Archivist Meetings: Never   Marital Status: Married  Human resources officer Violence: Not At Risk   Fear of Current or Ex-Partner: No   Emotionally Abused: No   Physically Abused: No   Sexually Abused: No    Family History: Family History  Problem Relation Age of Onset   Hypertension Mother    Hypertension Father    Heart disease Maternal Grandfather    Cancer Paternal Grandfather     Review of Systems: ROS  Physical Exam: Vital Signs BP 105/72 (BP Location: Left Arm, Patient Position: Sitting, Cuff Size: Small)    Pulse 72    Ht 5' (1.524 m)    Wt 160 lb (72.6 kg)    SpO2 99%    BMI 31.25 kg/m   Physical Exam Constitutional:      General: Not in acute distress.    Appearance: Normal appearance. Not ill-appearing.  HENT:     Head: Normocephalic and atraumatic.  Eyes:     Pupils: Pupils are equal, round Neck:     Musculoskeletal: Normal range of motion.  Cardiovascular:     Rate and Rhythm: Normal rate    Pulses: Normal pulses.  Pulmonary:     Effort: Pulmonary effort is  normal. No respiratory distress.  Musculoskeletal: Normal range of motion.  Skin:    General: Skin is warm and dry.     Findings: No erythema or rash.  Neurological:     General: No focal deficit present.     Mental Status: Alert and oriented to person, place, and time. Mental status is at baseline.     Motor: No weakness.  Psychiatric:        Mood and Affect: Mood normal.  Behavior: Behavior normal.    Assessment/Plan: The patient is scheduled for bilateral breast reduction with Dr. Marla Roe.  Risks, benefits, and alternatives of procedure discussed, questions answered and consent obtained.    Smoking Status: Non-smoker, quit approximately 12 years ago Last Mammogram: 06/23/2021 with subsequent ultrasound and biopsy of right breast showing fibroadenoma.;   Caprini Score: 3, moderate; Risk Factors include: Age, BMI greater than 25, and length of planned surgery. Recommendation for mechanical prophylaxis. Encourage early ambulation.   Pictures obtained: @consult   Post-op Rx sent to pharmacy:  Previous prescription for Norco, Zofran and Keflex sent to pharmacy, patient confirmed to pick up today  Patient was provided with the breast reduction and General Surgical Risk consent document and Pain Medication Agreement prior to their appointment.  They had adequate time to read through the risk consent documents and Pain Medication Agreement. We also discussed them in person together during this preop appointment. All of their questions were answered to their satisfaction.  Recommended calling if they have any further questions.  Risk consent form and Pain Medication Agreement to be scanned into patient's chart.  The risk that can be encountered with breast reduction were discussed and include the following but not limited to these:  Breast asymmetry, fluid accumulation, firmness of the breast, inability to breast feed, loss of nipple or areola, skin loss, decrease or no nipple  sensation, fat necrosis of the breast tissue, bleeding, infection, healing delay.  There are risks of anesthesia, changes to skin sensation and injury to nerves or blood vessels.  The muscle can be temporarily or permanently injured.  You may have an allergic reaction to tape, suture, glue, blood products which can result in skin discoloration, swelling, pain, skin lesions, poor healing.  Any of these can lead to the need for revisonal surgery or stage procedures.  A reduction has potential to interfere with diagnostic procedures.  Nipple or breast piercing can increase risks of infection.  This procedure is best done when the breast is fully developed.  Changes in the breast will continue to occur over time.  Pregnancy can alter the outcomes of previous breast reduction surgery, weight gain and weigh loss can also effect the long term appearance.   We discussed that due to the current size of her breasts that the desired postoperative breast size of a B cup would be unlikely.  Patient was understanding of this and we discussed the reasoning for this including the pedicle needing adequate blood supply to the NAC.  She would like to be as small as possible without avoiding increased risk of postoperative complications.  Electronically signed by: Melanie Rhine Tamitha Norell, PA-C 10/19/2021 9:58 AM

## 2021-10-19 NOTE — Progress Notes (Signed)
Patient ID: Melanie Rose, female    DOB: 1981/01/01, 41 y.o.   MRN: [id number]  Chief Complaint  Patient presents with   Pre-op Exam      ICD-10-CM   1. Symptomatic mammary hypertrophy  N62     2. Chronic bilateral thoracic back pain  M54.6    G89.29       History of Present Illness: Melanie Rose is a 41 y.o.  female  with a history of macromastia.  She presents for preoperative evaluation for upcoming procedure, Bilateral Breast Reduction, scheduled for 11/05/2021 with Dr.  Marla Roe  The patient has not had problems with anesthesia. No history of DVT/PE.  No family history of DVT/PE.  No family or personal history of bleeding or clotting disorders.  Patient is not currently taking any blood thinners.  No history of CVA/MI.   Summary of Previous Visit: STN 39 cm bilaterally, preoperative bra size is a 36 I cup.  She would like to be about a B cup.  No family history of breast cancer.  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  She has a history of hypertrophic scarring.  Estimated excess breast tissue to be removed at time of surgery: 550 grams  PMH Significant for: Thyroid disorder, GERD, fibromyalgia  Patient has seen her PCP and cardiology for preoperative clearance previously within the last 6 months. Per PCP patient is low risk.  Per cardiology no high risk features or findings, no further cardiovascular testing was necessary.  Patient had previous preoperative appointment with me on 09/15/2021, however surgery was canceled due to patient testing positive for COVID-19.  She reports she has been feeling well since then, no recent changes in her health otherwise.  She is ready for surgery.  She is here today with her husband.  Past Medical History: Allergies: Allergies  Allergen Reactions   Sulfa Antibiotics Rash    Current Medications:  Current Outpatient Medications:    levothyroxine (SYNTHROID) 112 MCG tablet, Take 1 tablet (112  mcg total) by mouth daily before breakfast., Disp: 30 tablet, Rfl: 3   ondansetron (ZOFRAN) 4 MG tablet, Take 1 tablet (4 mg total) by mouth every 8 (eight) hours as needed for nausea or vomiting., Disp: 20 tablet, Rfl: 0  Past Medical Problems: 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   Vaginal Pap smear, abnormal     Past Surgical History: Past Surgical History:  Procedure Laterality Date   BREAST BIOPSY     negative   BREAST LUMPECTOMY WITH RADIOACTIVE SEED LOCALIZATION Left 03/07/2018   Procedure: LEFT BREAST LUMPECTOMY WITH RADIOACTIVE SEED LOCALIZATION;  Surgeon: Erroll Luna, MD;  Location: [facility];  Service: General;  Laterality: Left;   LAPAROSCOPIC TUBAL LIGATION Bilateral 01/16/2015   Procedure: LAPAROSCOPIC TUBAL LIGATION;  Surgeon: Newton Pigg, MD;  Location: [facility];  Service: Gynecology;  Laterality: Bilateral;   TUBAL LIGATION      Social History: Social History   Socioeconomic History   Marital status: Married    Spouse name: Not on file   Number of children: Not on file  Years of education: Not on file   Highest education level: Not on file  Occupational History   Not on file  Tobacco Use   Smoking status: Former    Packs/day: 1.00    Years: 10.00    Pack years: 10.00    Types: Cigarettes    Quit date: 06/22/2009    Years since quitting: 12.3   Smokeless tobacco: Never  Vaping Use   Vaping Use: Never used  Substance and Sexual Activity   Alcohol use: No   Drug use: No   Sexual activity: Yes    Birth control/protection: Surgical    Comment: tubal  Other Topics Concern   Not on file  Social History Narrative   Melanie Rose is a pleasant female who is  married and has 2 children.     Social Determinants of Health   Financial Resource Strain: Low Risk    Difficulty of Paying Living Expenses: Not hard at all  Food Insecurity: No Food Insecurity   Worried About Charity fundraiser in the Last Year: Never true   [place] in the Last Year: Never true  Transportation Needs: No Transportation Needs   Lack of Transportation (Medical): No   Lack of Transportation (Non-Medical): No  Physical Activity: Sufficiently Active   Days of Exercise per Week: 5 days   Minutes of Exercise per Session: 50 min  Stress: No Stress Concern Present   Feeling of Stress : Not at all  Social Connections: Moderately Integrated   Frequency of Communication with Friends and Family: Twice a week   Frequency of Social Gatherings with Friends and Family: Once a week   Attends Religious Services: Never   Marine scientist or Organizations: Yes   Attends Archivist Meetings: Never   Marital Status: Married  Human resources officer Violence: Not At Risk   Fear of Current or Ex-Partner: No   Emotionally Abused: No   Physically Abused: No   Sexually Abused: No    Family History: Family History  Problem Relation Age of Onset   Hypertension Mother    Hypertension Father    Heart disease Maternal Grandfather    Cancer Paternal Grandfather     Review of Systems: ROS  Physical Exam: Vital Signs BP 105/72 (BP Location: Left Arm, Patient Position: Sitting, Cuff Size: Small)    Pulse 72    Ht 5' (1.524 m)    Wt 160 lb (72.6 kg)    SpO2 99%    BMI 31.25 kg/m   Physical Exam Constitutional:      General: Not in acute distress.    Appearance: Normal appearance. Not ill-appearing.  HENT:     Head: Normocephalic and atraumatic.  Eyes:     Pupils: Pupils are equal, round Neck:     Musculoskeletal: Normal range of motion.  Cardiovascular:     Rate and Rhythm: Normal rate    Pulses: Normal pulses.  Pulmonary:     Effort: Pulmonary effort is  normal. No respiratory distress.  Musculoskeletal: Normal range of motion.  Skin:    General: Skin is warm and dry.     Findings: No erythema or rash.  Neurological:     General: No focal deficit present.     Mental Status: Alert and oriented to person, place, and time. Mental status is at baseline.     Motor: No weakness.  Psychiatric:        Mood and Affect: Mood normal.  Behavior: Behavior normal.    Assessment/Plan: The patient is scheduled for bilateral breast reduction with Dr. Marla Roe.  Risks, benefits, and alternatives of procedure discussed, questions answered and consent obtained.    Smoking Status: Non-smoker, quit approximately 12 years ago Last Mammogram: 06/23/2021 with subsequent ultrasound and biopsy of right breast showing fibroadenoma.;   Caprini Score: 3, moderate; Risk Factors include: Age, BMI greater than 25, and length of planned surgery. Recommendation for mechanical prophylaxis. Encourage early ambulation.   Pictures obtained: @consult   Post-op Rx sent to pharmacy:  Previous prescription for Norco, Zofran and Keflex sent to pharmacy, patient confirmed to pick up today  Patient was provided with the breast reduction and General Surgical Risk consent document and Pain Medication Agreement prior to their appointment.  They had adequate time to read through the risk consent documents and Pain Medication Agreement. We also discussed them in person together during this preop appointment. All of their questions were answered to their satisfaction.  Recommended calling if they have any further questions.  Risk consent form and Pain Medication Agreement to be scanned into patient's chart.  The risk that can be encountered with breast reduction were discussed and include the following but not limited to these:  Breast asymmetry, fluid accumulation, firmness of the breast, inability to breast feed, loss of nipple or areola, skin loss, decrease or no nipple  sensation, fat necrosis of the breast tissue, bleeding, infection, healing delay.  There are risks of anesthesia, changes to skin sensation and injury to nerves or blood vessels.  The muscle can be temporarily or permanently injured.  You may have an allergic reaction to tape, suture, glue, blood products which can result in skin discoloration, swelling, pain, skin lesions, poor healing.  Any of these can lead to the need for revisonal surgery or stage procedures.  A reduction has potential to interfere with diagnostic procedures.  Nipple or breast piercing can increase risks of infection.  This procedure is best done when the breast is fully developed.  Changes in the breast will continue to occur over time.  Pregnancy can alter the outcomes of previous breast reduction surgery, weight gain and weigh loss can also effect the long term appearance.   We discussed that due to the current size of her breasts that the desired postoperative breast size of a B cup would be unlikely.  Patient was understanding of this and we discussed the reasoning for this including the pedicle needing adequate blood supply to the NAC.  She would like to be as small as possible without avoiding increased risk of postoperative complications.  Electronically signed by: Carola Rhine Randal Goens, PA-C 10/19/2021 9:58 AM

## 2021-10-20 ENCOUNTER — Encounter: Payer: BC Managed Care – PPO | Admitting: Plastic Surgery

## 2021-10-28 ENCOUNTER — Encounter (HOSPITAL_BASED_OUTPATIENT_CLINIC_OR_DEPARTMENT_OTHER): Payer: Self-pay | Admitting: Plastic Surgery

## 2021-10-28 ENCOUNTER — Other Ambulatory Visit: Payer: Self-pay

## 2021-11-03 ENCOUNTER — Encounter: Payer: BC Managed Care – PPO | Admitting: Surgical

## 2021-11-05 ENCOUNTER — Other Ambulatory Visit: Payer: Self-pay

## 2021-11-05 ENCOUNTER — Ambulatory Visit (HOSPITAL_BASED_OUTPATIENT_CLINIC_OR_DEPARTMENT_OTHER): Payer: BC Managed Care – PPO | Admitting: Anesthesiology

## 2021-11-05 ENCOUNTER — Encounter (HOSPITAL_BASED_OUTPATIENT_CLINIC_OR_DEPARTMENT_OTHER): Payer: Self-pay | Admitting: Plastic Surgery

## 2021-11-05 ENCOUNTER — Encounter (HOSPITAL_BASED_OUTPATIENT_CLINIC_OR_DEPARTMENT_OTHER)
Admission: RE | Disposition: A | Discharge status: Home / Self Care | Payer: Self-pay | Source: Home / Self Care | Attending: Plastic Surgery

## 2021-11-05 ENCOUNTER — Ambulatory Visit (HOSPITAL_BASED_OUTPATIENT_CLINIC_OR_DEPARTMENT_OTHER)
Admission: RE | Admit: 2021-11-05 | Discharge: 2021-11-05 | Disposition: A | Discharge status: Home / Self Care | Payer: BC Managed Care – PPO | Attending: Plastic Surgery | Admitting: Plastic Surgery

## 2021-11-05 DIAGNOSIS — G8929 Other chronic pain: Secondary | ICD-10-CM | POA: Insufficient documentation

## 2021-11-05 DIAGNOSIS — D225 Melanocytic nevi of trunk: Secondary | ICD-10-CM | POA: Diagnosis not present

## 2021-11-05 DIAGNOSIS — Z8616 Personal history of COVID-19: Secondary | ICD-10-CM | POA: Diagnosis not present

## 2021-11-05 DIAGNOSIS — N62 Hypertrophy of breast: Secondary | ICD-10-CM | POA: Insufficient documentation

## 2021-11-05 DIAGNOSIS — M546 Pain in thoracic spine: Secondary | ICD-10-CM | POA: Diagnosis not present

## 2021-11-05 DIAGNOSIS — N6012 Diffuse cystic mastopathy of left breast: Secondary | ICD-10-CM | POA: Diagnosis not present

## 2021-11-05 DIAGNOSIS — M549 Dorsalgia, unspecified: Secondary | ICD-10-CM | POA: Diagnosis not present

## 2021-11-05 DIAGNOSIS — M797 Fibromyalgia: Secondary | ICD-10-CM | POA: Diagnosis not present

## 2021-11-05 DIAGNOSIS — N6042 Mammary duct ectasia of left breast: Secondary | ICD-10-CM | POA: Insufficient documentation

## 2021-11-05 DIAGNOSIS — Z87891 Personal history of nicotine dependence: Secondary | ICD-10-CM | POA: Diagnosis not present

## 2021-11-05 DIAGNOSIS — K219 Gastro-esophageal reflux disease without esophagitis: Secondary | ICD-10-CM | POA: Diagnosis not present

## 2021-11-05 DIAGNOSIS — N6041 Mammary duct ectasia of right breast: Secondary | ICD-10-CM | POA: Insufficient documentation

## 2021-11-05 DIAGNOSIS — E039 Hypothyroidism, unspecified: Secondary | ICD-10-CM | POA: Insufficient documentation

## 2021-11-05 DIAGNOSIS — M542 Cervicalgia: Secondary | ICD-10-CM | POA: Diagnosis not present

## 2021-11-05 DIAGNOSIS — N6011 Diffuse cystic mastopathy of right breast: Secondary | ICD-10-CM | POA: Diagnosis not present

## 2021-11-05 HISTORY — PX: BREAST REDUCTION SURGERY: SHX8

## 2021-11-05 LAB — POCT PREGNANCY, URINE: Preg Test, Ur: NEGATIVE

## 2021-11-05 SURGERY — BREAST REDUCTION WITH LIPOSUCTION
Anesthesia: General | Site: Breast | Laterality: Bilateral

## 2021-11-05 MED ORDER — SCOPOLAMINE 1 MG/3DAYS TD PT72
MEDICATED_PATCH | TRANSDERMAL | Status: AC
  Filled 2021-11-05: qty 1

## 2021-11-05 MED ORDER — SCOPOLAMINE 1 MG/3DAYS TD PT72
1.0000 | MEDICATED_PATCH | TRANSDERMAL | Status: DC
Start: 2021-11-05 — End: 2021-11-05
  Administered 2021-11-05: 1.5 mg via TRANSDERMAL

## 2021-11-05 MED ORDER — ACETAMINOPHEN 500 MG PO TABS
ORAL_TABLET | ORAL | Status: AC
  Filled 2021-11-05: qty 2

## 2021-11-05 MED ORDER — ALBUMIN HUMAN 5 % IV SOLN: INTRAVENOUS | Status: DC | PRN

## 2021-11-05 MED ORDER — FENTANYL CITRATE (PF) 100 MCG/2ML IJ SOLN: 25.0000 ug | INTRAMUSCULAR | Status: DC | PRN

## 2021-11-05 MED ORDER — ACETAMINOPHEN 500 MG PO TABS
ORAL_TABLET | ORAL | Status: AC
  Filled 2021-11-05: qty 1

## 2021-11-05 MED ORDER — ONDANSETRON HCL 4 MG/2ML IJ SOLN: 4.0000 mg | Freq: Once | INTRAMUSCULAR | Status: DC | PRN

## 2021-11-05 MED ORDER — ACETAMINOPHEN 500 MG PO TABS
1000.0000 mg | ORAL_TABLET | Freq: Once | ORAL | Status: AC
  Administered 2021-11-05: 1000 mg via ORAL

## 2021-11-05 MED ORDER — FENTANYL CITRATE (PF) 100 MCG/2ML IJ SOLN
INTRAMUSCULAR | Status: AC
  Filled 2021-11-05: qty 2

## 2021-11-05 MED ORDER — PHENYLEPHRINE HCL (PRESSORS) 10 MG/ML IV SOLN
INTRAVENOUS | Status: DC | PRN
  Administered 2021-11-05 (×2): 80 ug via INTRAVENOUS

## 2021-11-05 MED ORDER — ACETAMINOPHEN 325 MG PO TABS: 650.0000 mg | ORAL_TABLET | ORAL | Status: DC | PRN

## 2021-11-05 MED ORDER — FENTANYL CITRATE (PF) 100 MCG/2ML IJ SOLN
25.0000 ug | INTRAMUSCULAR | Status: DC | PRN
  Administered 2021-11-05 (×2): 50 ug via INTRAVENOUS
  Administered 2021-11-05 (×2): 25 ug via INTRAVENOUS

## 2021-11-05 MED ORDER — LACTATED RINGERS IV SOLN: INTRAVENOUS | Status: DC

## 2021-11-05 MED ORDER — MIDAZOLAM HCL 2 MG/2ML IJ SOLN
INTRAMUSCULAR | Status: AC
  Filled 2021-11-05: qty 2

## 2021-11-05 MED ORDER — FENTANYL CITRATE (PF) 100 MCG/2ML IJ SOLN
INTRAMUSCULAR | Status: DC | PRN
  Administered 2021-11-05: 25 ug via INTRAVENOUS
  Administered 2021-11-05: 100 ug via INTRAVENOUS
  Administered 2021-11-05: 25 ug via INTRAVENOUS
  Administered 2021-11-05: 50 ug via INTRAVENOUS

## 2021-11-05 MED ORDER — LIDOCAINE HCL (CARDIAC) PF 100 MG/5ML IV SOSY
PREFILLED_SYRINGE | INTRAVENOUS | Status: DC | PRN
  Administered 2021-11-05: 80 mg via INTRAVENOUS

## 2021-11-05 MED ORDER — OXYCODONE HCL 5 MG PO TABS
ORAL_TABLET | ORAL | Status: AC
  Filled 2021-11-05: qty 1

## 2021-11-05 MED ORDER — EPHEDRINE 5 MG/ML INJ
INTRAVENOUS | Status: AC
  Filled 2021-11-05: qty 5

## 2021-11-05 MED ORDER — ALBUMIN HUMAN 5 % IV SOLN
INTRAVENOUS | Status: AC
  Filled 2021-11-05: qty 250

## 2021-11-05 MED ORDER — DEXAMETHASONE SODIUM PHOSPHATE 10 MG/ML IJ SOLN
INTRAMUSCULAR | Status: AC
  Filled 2021-11-05: qty 1

## 2021-11-05 MED ORDER — SUGAMMADEX SODIUM 200 MG/2ML IV SOLN
INTRAVENOUS | Status: DC | PRN
  Administered 2021-11-05: 200 mg via INTRAVENOUS

## 2021-11-05 MED ORDER — ROCURONIUM BROMIDE 100 MG/10ML IV SOLN
INTRAVENOUS | Status: DC | PRN
  Administered 2021-11-05: 60 mg via INTRAVENOUS

## 2021-11-05 MED ORDER — PROPOFOL 10 MG/ML IV BOLUS
INTRAVENOUS | Status: DC | PRN
  Administered 2021-11-05: 200 mg via INTRAVENOUS

## 2021-11-05 MED ORDER — ROCURONIUM BROMIDE 10 MG/ML (PF) SYRINGE
PREFILLED_SYRINGE | INTRAVENOUS | Status: AC
  Filled 2021-11-05: qty 10

## 2021-11-05 MED ORDER — LIDOCAINE HCL 1 % IJ SOLN
INTRAVENOUS | Status: DC | PRN
  Administered 2021-11-05: 400 mL

## 2021-11-05 MED ORDER — LIDOCAINE 2% (20 MG/ML) 5 ML SYRINGE
INTRAMUSCULAR | Status: AC
  Filled 2021-11-05: qty 5

## 2021-11-05 MED ORDER — SODIUM CHLORIDE 0.9 % IV SOLN: 250.0000 mL | INTRAVENOUS | Status: DC | PRN

## 2021-11-05 MED ORDER — AMISULPRIDE (ANTIEMETIC) 5 MG/2ML IV SOLN: 10.0000 mg | Freq: Once | INTRAVENOUS | Status: DC | PRN

## 2021-11-05 MED ORDER — OXYCODONE HCL 5 MG/5ML PO SOLN: 5.0000 mg | Freq: Once | ORAL | Status: AC | PRN

## 2021-11-05 MED ORDER — MIDAZOLAM HCL 5 MG/5ML IJ SOLN
INTRAMUSCULAR | Status: DC | PRN
  Administered 2021-11-05: 2 mg via INTRAVENOUS

## 2021-11-05 MED ORDER — CHLORHEXIDINE GLUCONATE CLOTH 2 % EX PADS: 6.0000 | MEDICATED_PAD | Freq: Once | CUTANEOUS | Status: DC

## 2021-11-05 MED ORDER — CEFAZOLIN SODIUM-DEXTROSE 2-4 GM/100ML-% IV SOLN
2.0000 g | INTRAVENOUS | Status: AC
  Administered 2021-11-05: 2 g via INTRAVENOUS

## 2021-11-05 MED ORDER — EPHEDRINE SULFATE (PRESSORS) 50 MG/ML IJ SOLN
INTRAMUSCULAR | Status: DC | PRN
  Administered 2021-11-05: 10 mg via INTRAVENOUS

## 2021-11-05 MED ORDER — PHENYLEPHRINE 40 MCG/ML (10ML) SYRINGE FOR IV PUSH (FOR BLOOD PRESSURE SUPPORT)
PREFILLED_SYRINGE | INTRAVENOUS | Status: AC
  Filled 2021-11-05: qty 10

## 2021-11-05 MED ORDER — ONDANSETRON HCL 4 MG/2ML IJ SOLN
INTRAMUSCULAR | Status: DC | PRN
  Administered 2021-11-05: 4 mg via INTRAVENOUS

## 2021-11-05 MED ORDER — ATROPINE SULFATE 0.4 MG/ML IV SOLN
INTRAVENOUS | Status: AC
  Filled 2021-11-05: qty 1

## 2021-11-05 MED ORDER — SUCCINYLCHOLINE CHLORIDE 200 MG/10ML IV SOSY
PREFILLED_SYRINGE | INTRAVENOUS | Status: AC
  Filled 2021-11-05: qty 10

## 2021-11-05 MED ORDER — ACETAMINOPHEN 325 MG RE SUPP: 650.0000 mg | RECTAL | Status: DC | PRN

## 2021-11-05 MED ORDER — SODIUM CHLORIDE 0.9% FLUSH: 3.0000 mL | Freq: Two times a day (BID) | INTRAVENOUS | Status: DC

## 2021-11-05 MED ORDER — CEFAZOLIN SODIUM-DEXTROSE 2-4 GM/100ML-% IV SOLN
INTRAVENOUS | Status: AC
  Filled 2021-11-05: qty 100

## 2021-11-05 MED ORDER — DEXAMETHASONE SODIUM PHOSPHATE 4 MG/ML IJ SOLN
INTRAMUSCULAR | Status: DC | PRN
  Administered 2021-11-05: 5 mg via INTRAVENOUS

## 2021-11-05 MED ORDER — SODIUM CHLORIDE 0.9% FLUSH: 3.0000 mL | INTRAVENOUS | Status: DC | PRN

## 2021-11-05 MED ORDER — LIDOCAINE-EPINEPHRINE 1 %-1:100000 IJ SOLN
INTRAMUSCULAR | Status: DC | PRN
  Administered 2021-11-05: 50 mL

## 2021-11-05 MED ORDER — ONDANSETRON HCL 4 MG/2ML IJ SOLN
INTRAMUSCULAR | Status: AC
  Filled 2021-11-05: qty 2

## 2021-11-05 MED ORDER — OXYCODONE HCL 5 MG PO TABS
5.0000 mg | ORAL_TABLET | Freq: Once | ORAL | Status: AC | PRN
  Administered 2021-11-05: 5 mg via ORAL

## 2021-11-05 MED ORDER — OXYCODONE HCL 5 MG PO TABS: 5.0000 mg | ORAL_TABLET | ORAL | Status: DC | PRN

## 2021-11-05 SURGICAL SUPPLY — 51 items
BINDER BREAST XLRG (GAUZE/BANDAGES/DRESSINGS) ×1 IMPLANT
BLADE HEX COATED 2.75 (ELECTRODE) ×2 IMPLANT
BLADE KNIFE PERSONA 10 (BLADE) ×4 IMPLANT
BLADE SURG 15 STRL LF DISP TIS (BLADE) IMPLANT
BLADE SURG 15 STRL SS (BLADE) ×1
CANISTER SUCT 1200ML W/VALVE (MISCELLANEOUS) ×2 IMPLANT
COVER BACK TABLE 60X90IN (DRAPES) ×2 IMPLANT
COVER MAYO STAND STRL (DRAPES) ×2 IMPLANT
DERMABOND ADVANCED (GAUZE/BANDAGES/DRESSINGS) ×3
DERMABOND ADVANCED .7 DNX12 (GAUZE/BANDAGES/DRESSINGS) ×2 IMPLANT
DRAPE LAPAROSCOPIC ABDOMINAL (DRAPES) ×2 IMPLANT
DRSG OPSITE POSTOP 4X12 (GAUZE/BANDAGES/DRESSINGS) ×2 IMPLANT
DRSG PAD ABDOMINAL 8X10 ST (GAUZE/BANDAGES/DRESSINGS) ×6 IMPLANT
ELECT BLADE 4.0 EZ CLEAN MEGAD (MISCELLANEOUS) ×2
ELECT REM PT RETURN 9FT ADLT (ELECTROSURGICAL) ×2
ELECTRODE BLDE 4.0 EZ CLN MEGD (MISCELLANEOUS) IMPLANT
ELECTRODE REM PT RTRN 9FT ADLT (ELECTROSURGICAL) ×1 IMPLANT
GLOVE SURG ENC MOIS LTX SZ6.5 (GLOVE) ×6 IMPLANT
GLOVE SURG ENC TEXT LTX SZ7.5 (GLOVE) ×2 IMPLANT
GOWN STRL REUS W/ TWL LRG LVL3 (GOWN DISPOSABLE) ×3 IMPLANT
GOWN STRL REUS W/ TWL XL LVL3 (GOWN DISPOSABLE) IMPLANT
GOWN STRL REUS W/TWL LRG LVL3 (GOWN DISPOSABLE) ×4
GOWN STRL REUS W/TWL XL LVL3 (GOWN DISPOSABLE) ×1
HEMOSTAT ARISTA ABSORB 3G PWDR (HEMOSTASIS) ×1 IMPLANT
NDL FILTER BLUNT 18X1 1/2 (NEEDLE) ×1 IMPLANT
NDL HYPO 25X1 1.5 SAFETY (NEEDLE) ×1 IMPLANT
NEEDLE FILTER BLUNT 18X 1/2SAF (NEEDLE) ×1
NEEDLE FILTER BLUNT 18X1 1/2 (NEEDLE) ×1 IMPLANT
NEEDLE HYPO 25X1 1.5 SAFETY (NEEDLE) ×2 IMPLANT
PACK BASIN DAY SURGERY FS (CUSTOM PROCEDURE TRAY) ×2 IMPLANT
PAD ALCOHOL SWAB (MISCELLANEOUS) ×1 IMPLANT
PENCIL SMOKE EVACUATOR (MISCELLANEOUS) ×2 IMPLANT
SLEEVE SCD COMPRESS KNEE MED (STOCKING) ×2 IMPLANT
SPONGE T-LAP 18X18 ~~LOC~~+RFID (SPONGE) ×6 IMPLANT
STRIP SUTURE WOUND CLOSURE 1/2 (MISCELLANEOUS) ×5 IMPLANT
SUT MNCRL AB 4-0 PS2 18 (SUTURE) ×10 IMPLANT
SUT MON AB 3-0 SH 27 (SUTURE) ×7
SUT MON AB 3-0 SH27 (SUTURE) ×4 IMPLANT
SUT PDS 3-0 CT2 (SUTURE) ×10
SUT PDS II 3-0 CT2 27 ABS (SUTURE) IMPLANT
SYR 3ML 23GX1 SAFETY (SYRINGE) ×1 IMPLANT
SYR 50ML LL SCALE MARK (SYRINGE) ×1 IMPLANT
SYR BULB IRRIG 60ML STRL (SYRINGE) ×2 IMPLANT
SYR CONTROL 10ML LL (SYRINGE) ×2 IMPLANT
TOWEL GREEN STERILE FF (TOWEL DISPOSABLE) ×4 IMPLANT
TRAY DSU PREP LF (CUSTOM PROCEDURE TRAY) ×2 IMPLANT
TUBE CONNECTING 20X1/4 (TUBING) ×2 IMPLANT
TUBING INFILTRATION IT-10001 (TUBING) ×1 IMPLANT
TUBING SET GRADUATE ASPIR 12FT (MISCELLANEOUS) ×1 IMPLANT
UNDERPAD 30X36 HEAVY ABSORB (UNDERPADS AND DIAPERS) ×4 IMPLANT
YANKAUER SUCT BULB TIP NO VENT (SUCTIONS) ×2 IMPLANT

## 2021-11-05 NOTE — Discharge Instructions (Addendum)
?Post Anesthesia Home Care Instructions ? ?Activity: ?Get plenty of rest for the remainder of the day. A responsible individual must stay with you for 24 hours following the procedure.  ?For the next 24 hours, DO NOT: ?-Drive a car ?-Paediatric nurse ?-Drink alcoholic beverages ?-Take any medication unless instructed by your physician ?-Make any legal decisions or sign important papers. ? ?Meals: ?Start with liquid foods such as gelatin or soup. Progress to regular foods as tolerated. Avoid greasy, spicy, heavy foods. If nausea and/or vomiting occur, drink only clear liquids until the nausea and/or vomiting subsides. Call your physician if vomiting continues. ? ?Special Instructions/Symptoms: ?Your throat may feel dry or sore from the anesthesia or the breathing tube placed in your throat during surgery. If this causes discomfort, gargle with warm salt water. The discomfort should disappear within 24 hours. ? ?You have a scopolamine patch placed behind your left ear for the management of post- operative nausea and/or vomiting: ? ?1. The medication in the patch is effective for 72 hours, after which it should be removed.  Wrap patch in a tissue and discard in the trash. Wash hands thoroughly with soap and water. ?2. You may remove the patch earlier than 72 hours if you experience unpleasant side effects which may include dry mouth, dizziness or visual disturbances. ?3. Avoid touching the patch. Wash your hands with soap and water after contact with the patch. ?    ?NO ADDITIONAL TYLENOL UNTIL AFTER 1:30PM ? ?INSTRUCTIONS FOR AFTER BREAST SURGERY ? ? ?You will likely have some questions about what to expect following your operation.  The following information will help you and your family understand what to expect when you are discharged from the hospital.  Following these guidelines will help ensure a smooth recovery and reduce risks of complications.  Postoperative instructions include information on: diet, wound  care, medications and physical activity. ? ?AFTER SURGERY ?Expect to go home after the procedure.  In some cases, you may need to spend one night in the hospital for observation. ? ?DIET ?Breast surgery does not require a specific diet.  However, the healthier you eat the better your body can start healing. It is important to increasing your protein intake.  This means limiting the foods with sugar and carbohydrates.  Focus on vegetables and some meat.  If you have any liposuction during your procedure be sure to drink water.  If your urine is bright yellow, then it is concentrated, and you need to drink more water.  As a general rule after surgery, you should have 8 ounces of water every hour while awake.  If you find you are persistently nauseated or unable to take in liquids let us know.  NO TOBACCO USE or EXPOSURE.  This will slow your healing process and increase the risk of a wound. ? ?WOUND CARE ?Leave the ACE wrap or binder on for 3 days . Use fragrance free soap.   ?After 3 days you can remove the ACE wrap or binder to shower. Once dry apply ACE wrap, binder or sports bra.  Use a mild soap like Dial, Dove and [place]. ?You may have Topifoam or Lipofoam on.  It is soft and spongy and helps keep you from getting creases if you have liposuction.  This can be removed before the shower and then replaced.  If you need more it is available on [employer] (Lipofoam). ?If you have steri-strips / tape directly attached to your skin leave them in place. It is OK  to get these wet.   ?No baths, pools or hot tubs for four weeks. ?We close your incision to leave the smallest and best-looking scar. No ointment or creams on your incisions until given the go ahead.  Especially not Neosporin (Too many skin reactions with this one).  A few weeks after surgery you can use Mederma and start massaging the scar. ?We ask you to wear your binder or sports bra for the first 6 weeks around the clock, including while sleeping. This provides  added comfort and helps reduce the fluid accumulation at the surgery site. ? ?ACTIVITY ?No heavy lifting until cleared by the doctor.  This usually means no more than a half-gallon of milk.  It is OK to walk and climb stairs. In fact, moving your legs is very important to decrease your risk of a blood clot.  It will also help keep you from getting deconditioned.  Every 1 to 2 hours get up and walk for 5 minutes. This will help with a quicker recovery back to normal.  Let pain be your guide so you don't do too much.  This is not the time for spring cleaning and don't plan on taking care of anyone else.  This time is for you to recover,  ?You will be more comfortable if you sleep and rest with your head elevated either with a few pillows under you or in a recliner.  No stomach sleeping for a three months. ? ?WORK ?Everyone returns to work at different times. As a rough guide, most people take at least 1 - 2 weeks off prior to returning to work. If you need documentation for your job, bring the forms to your postoperative follow up visit. ? ?DRIVING ?Arrange for someone to bring you home from the hospital.  You may be able to drive a few days after surgery but not while taking any narcotics or valium. ? ?BOWEL MOVEMENTS ?Constipation can occur after anesthesia and while taking pain medication.  It is important to stay ahead for your comfort.  We recommend taking Milk of Magnesia (2 tablespoons; twice a day) while taking the pain pills. ? ?MEDICATIONS You may be prescribed should start after surgery ?At your preoperative visit for you history and physical you may have been given the following medications: ?An antibiotic: Start this medication when you get home and take according to the instructions on the bottle. ?Zofran 4 mg:  This is to treat nausea and vomiting.  You can take this every 6 hours as needed and only if needed. ?Valium 2 mg: This is for muscle tightness if you have an implant or expander. This will help  relax your muscle which also helps with pain control.  This can be taken every 12 hours as needed. Don't drive after taking this medication. ?Norco (hydrocodone/acetaminophen) 5/325 mg:  This is only to be used after you have taken the motrin or the tylenol. Every 8 hours as needed. ? ? ?Over the counter Medication to take: ?Ibuprofen (Motrin) 600 mg:  Take this every 6 hours.  If you have additional pain then take 500 mg of the tylenol every 8 hours.  Only take the Norco after you have tried these two. ?Miralax or stool softener of choice: Take this according to the bottle if you take the Norco. ? ?WHEN TO CALL ?Call your surgeon's office if any of the following occur: ?Fever 101 degrees F or greater ?Excessive bleeding or fluid from the incision site. ?Pain that increases over  time without aid from the medications ?Redness, warmth, or pus draining from incision sites ?Persistent nausea or inability to take in liquids ?Severe misshapen area that underwent the operation. ?

## 2021-11-05 NOTE — Interval H&P Note (Signed)
History and Physical Interval Note: ? ?11/05/2021 ?10:30 AM ? ?Melanie Rose  has presented today for surgery, with the diagnosis of Symptomatic mammary hypertrophy.  The various methods of treatment have been discussed with the patient and family. After consideration of risks, benefits and other options for treatment, the patient has consented to  Procedure(s) with comments: ?MAMMARY REDUCTION  (BREAST) (Bilateral) - 3 hrs as a surgical intervention.  The patient's history has been reviewed, patient examined, no change in status, stable for surgery.  I have reviewed the patient's chart and labs.  Questions were answered to the patient's satisfaction.   ? ? ?Loel Lofty Aleece Loyd ? ? ?

## 2021-11-05 NOTE — Anesthesia Preprocedure Evaluation (Addendum)
Anesthesia Evaluation  ?Patient identified by MRN, date of birth, ID band ?Patient awake ? ? ? ?Reviewed: ?Allergy & Precautions, NPO status , Patient's Chart, lab work & pertinent test results ? ?Airway ?Mallampati: II ? ?TM Distance: >3 FB ?Neck ROM: Full ? ? ? Dental ?no notable dental hx. ? ?  ?Pulmonary ?neg pulmonary ROS, former smoker,  ?  ?Pulmonary exam normal ?breath sounds clear to auscultation ? ? ? ? ? ? Cardiovascular ?Exercise Tolerance: Good ?negative cardio ROS ?Normal cardiovascular exam ?Rhythm:Regular Rate:Normal ? ? ?  ?Neuro/Psych ? Headaches, negative psych ROS  ? GI/Hepatic ?Neg liver ROS, GERD  ,  ?Endo/Other  ?Hypothyroidism  ? Renal/GU ?negative Renal ROS  ?negative genitourinary ?  ?Musculoskeletal ? ?(+) Fibromyalgia - ? Abdominal ?  ?Peds ?negative pediatric ROS ?(+)  Hematology ?negative hematology ROS ?(+)   ?Anesthesia Other Findings ? ? Reproductive/Obstetrics ?negative OB ROS ? ?  ? ? ? ? ? ? ? ? ? ? ? ? ? ?  ?  ? ? ? ? ? ? ? ?Anesthesia Physical ?Anesthesia Plan ? ?ASA: 2 ? ?Anesthesia Plan: General  ? ?Post-op Pain Management: Tylenol PO (pre-op)* and Toradol IV (intra-op)*  ? ?Induction: Intravenous ? ?PONV Risk Score and Plan: 3 and Midazolam, Treatment may vary due to age or medical condition, Scopolamine patch - Pre-op, Dexamethasone and Ondansetron ? ?Airway Management Planned: Oral ETT ? ?Additional Equipment: None ? ?Intra-op Plan:  ? ?Post-operative Plan: Extubation in OR ? ?Informed Consent: I have reviewed the patients History and Physical, chart, labs and discussed the procedure including the risks, benefits and alternatives for the proposed anesthesia with the patient or authorized representative who has indicated his/her understanding and acceptance.  ? ? ? ?Dental advisory given ? ?Plan Discussed with: CRNA, Anesthesiologist and Surgeon ? ?Anesthesia Plan Comments:   ? ? ? ? ? ?Anesthesia Quick Evaluation ? ?

## 2021-11-05 NOTE — Anesthesia Procedure Notes (Signed)
Procedure Name: Intubation ?Date/Time: 11/05/2021 11:16 AM ?Performed by: Willa Frater, CRNA ?Pre-anesthesia Checklist: Patient identified, Emergency Drugs available, Suction available and Patient being monitored ?Patient Re-evaluated:Patient Re-evaluated prior to induction ?Oxygen Delivery Method: Circle system utilized ?Preoxygenation: Pre-oxygenation with 100% oxygen ?Induction Type: IV induction ?Ventilation: Mask ventilation without difficulty ?Laryngoscope Size: Mac and 3 ?Grade View: Grade II ?Tube type: Oral ?Tube size: 7.0 mm ?Number of attempts: 1 ?Airway Equipment and Method: Stylet and Oral airway ?Placement Confirmation: ETT inserted through vocal cords under direct vision, positive ETCO2 and breath sounds checked- equal and bilateral ?Secured at: 21 cm ?Tube secured with: Tape ?Dental Injury: Teeth and Oropharynx as per pre-operative assessment  ? ? ? ? ?

## 2021-11-05 NOTE — Transfer of Care (Signed)
Immediate Anesthesia Transfer of Care Note ? ?Patient: Melanie Rose ? ?Procedure(s) Performed: BREAST REDUCTION WITH LIPOSUCTION (Bilateral: Breast) ? ?Patient Location: PACU ? ?Anesthesia Type:General ? ?Level of Consciousness: sedated ? ?Airway & Oxygen Therapy: Patient Spontanous Breathing and Patient connected to face mask oxygen ? ?Post-op Assessment: Report given to RN and Post -op Vital signs reviewed and stable ? ?Post vital signs: Reviewed and stable ? ?Last Vitals:  ?Vitals Value Taken Time  ?BP    ?Temp    ?Pulse 99 11/05/21 1344  ?Resp 10 11/05/21 1344  ?SpO2 100 % 11/05/21 1344  ?Vitals shown include unvalidated device data. ? ?Last Pain:  ?Vitals:  ? 11/05/21 0908  ?TempSrc: Oral  ?PainSc: 0-No pain  ?   ? ?Patients Stated Pain Goal: 4 (11/05/21 0908) ? ?Complications: No notable events documented. ?

## 2021-11-05 NOTE — Op Note (Addendum)
Breast Reduction Op note:  ? ? ?DATE OF PROCEDURE: 11/05/2021 ? ?LOCATION: [facility] ? ?SURGEON: Kairee Kozma Sanger Yanette Tripoli, DO ? ?ASSISTANT: Roetta Sessions, PA ? ?PREOPERATIVE DIAGNOSIS ?1. Macromastia ?2. Neck Pain ?3. Back Pain ? ?POSTOPERATIVE DIAGNOSIS ?1. Macromastia ?2. Neck Pain ?3. Back Pain ? ?PROCEDURES ?1. Bilateral breast reduction.  Right reduction 680 g, Left reduction 690 g ? ?COMPLICATIONS: None. ? ?DRAINS: none ? ?INDICATIONS FOR PROCEDURE ?Melanie Rose is a 41 y.o. year-old female born on April 08, 1981,with a history of symptomatic macromastia with concominant back pain, neck pain, shoulder grooving from her bra.   ?MRN: [id number] ? ?CONSENT ?Informed consent was obtained directly from the patient. The risks, benefits and alternatives were fully discussed. Specific risks including but not limited to bleeding, infection, hematoma, seroma, scarring, pain, nipple necrosis, asymmetry, poor cosmetic results, and need for further surgery were discussed. The patient had ample opportunity to have her questions answered to her satisfaction. ? ?DESCRIPTION OF PROCEDURE ? ?Patient was brought into the operating room and placed in a supine position.  SCDs were placed and appropriate padding was performed.  Antibiotics were given. The patient underwent general anesthesia and the chest was prepped and draped in a sterile fashion.  A timeout was performed and all information was confirmed to be correct. Tumescent was placed laterally.  Liposuction was done laterally in each breast for improved contour. ? ?Right side: Preoperative markings were confirmed.  Incision lines were injected with local with epinephrine.  After waiting for vasoconstriction, the marked lines were incised.  A Wise-pattern superomedial breast reduction was performed by de-epithelializing the pedicle, using bovie to create the superomedial pedicle, and removing breast tissue from the lateral and inferior portions of the  breast.  Care was taken to not undermine the breast pedicle. Hemostasis was achieved.  The nipple was gently rotated into position and the soft tissue closed with 4-0 Monocryl.   The pocket was irrigated and hemostasis confirmed.  The deep tissues were approximated with 3-0 PDS and 3-0 Monocryl sutures and the skin was closed with deep dermal and subcuticular 4-0 Monocryl sutures.  The nipple and skin flaps had good capillary refill at the end of the procedure.   ? ?Left side: Preoperative markings were confirmed.  Incision lines were injected with local with epinephrine.  After waiting for vasoconstriction, the marked lines were incised.  A Wise-pattern superomedial breast reduction was performed by de-epithelializing the pedicle, using bovie to create the superomedial pedicle, and removing breast tissue from the lateral and inferior portions of the breast.  Care was taken to not undermine the breast pedicle. Hemostasis was achieved.  The nipple was gently rotated into position and the soft tissue was closed with 4-0 Monocryl.  The patient was sat upright and size and shape symmetry was confirmed.  The pocket was irrigated and hemostasis confirmed.  Thrombin was placed to help with microbleeding.  The deep tissues were approximated with 3-0 PDS and 3-0 Monocryl sutures and the skin was closed with deep dermal and subcuticular 4-0 Monocryl sutures.  Dermabond was applied.  A breast binder and ABDs were placed.  The nipple and skin flaps had good capillary refill at the end of the procedure.  The patient tolerated the procedure well. The patient was allowed to wake from anesthesia and taken to the recovery room in satisfactory condition. ? ?The advanced practice practitioner (APP) assisted throughout the case.  The APP was essential in retraction and counter traction when needed to make the  case progress smoothly.  This retraction and assistance made it possible to see the tissue plans for the procedure.  The  assistance was needed for blood control, tissue re-approximation and assisted with closure of the incision site. ? ?

## 2021-11-06 ENCOUNTER — Encounter: Payer: Self-pay | Admitting: Plastic Surgery

## 2021-11-06 ENCOUNTER — Telehealth: Payer: Self-pay | Admitting: Plastic Surgery

## 2021-11-06 LAB — SURGICAL PATHOLOGY

## 2021-11-06 NOTE — Telephone Encounter (Signed)
Called the patient back to check on her.  Patient stated that she's feeling a little better.  She said the Tylenol is working.//AB/CMA ?

## 2021-11-06 NOTE — Telephone Encounter (Signed)
Patient left voice message - states had breast reduction surgery yesterday, 11/05/21 - relays that she has a fever of 101.4; please advise. ?

## 2021-11-06 NOTE — Telephone Encounter (Signed)
Called and spoke with the patient regarding her MyChart message.  Asked the patient how long has she had a fever and has she taking anything for it.  She stated she has had the fever x 2 hours, and she has taking Ibuprofen which has been about 20 mins ago.  Informed the patient that I speak with Dr. Marla Roe and give her a call back.  Patient verbalized understanding and agreed.   ? ?Called the patient back and informed her that I spoke with Dr. Marla Roe and she stated to have the patient take Ibuprofen and Tylenol, and to get up and walk around.  Informed the patient to make sure she's drinking water.  Patient verbalized understanding and agreed.   ? ?Patient asked if there will be someone on call if she needs to call back.  Informed the patient that there will be someone on call.  She's to call our number and the doctor on call will call her back.  Patient verbalized understanding and agreed.//AB/CMA ?

## 2021-11-06 NOTE — Anesthesia Postprocedure Evaluation (Signed)
Anesthesia Post Note ? ?Patient: Melanie Rose ? ?Procedure(s) Performed: BREAST REDUCTION WITH LIPOSUCTION (Bilateral: Breast) ? ?  ? ?Patient location during evaluation: PACU ?Anesthesia Type: General ?Level of consciousness: awake and alert ?Pain management: pain level controlled ?Vital Signs Assessment: post-procedure vital signs reviewed and stable ?Respiratory status: spontaneous breathing, nonlabored ventilation and respiratory function stable ?Cardiovascular status: blood pressure returned to baseline and stable ?Postop Assessment: no apparent nausea or vomiting ?Anesthetic complications: no ? ? ?No notable events documented. ? ?Last Vitals:  ?Vitals:  ? 11/05/21 1445 11/05/21 1519  ?BP: 92/60 102/69  ?Pulse: 69 77  ?Resp: 17 18  ?Temp:  37 ?C  ?SpO2: 98% 98%  ?  ?Last Pain:  ?Vitals:  ? 11/05/21 1455  ?TempSrc:   ?PainSc: 4   ? ?Pain Goal: Patients Stated Pain Goal: 4 (11/05/21 1345) ? ?  ?  ?  ?  ?  ?  ?  ? ?Merlinda Frederick ? ? ? ? ?

## 2021-11-06 NOTE — Telephone Encounter (Signed)
See MyChart message on (11/06/21).//AB/CMA ?

## 2021-11-09 ENCOUNTER — Encounter (HOSPITAL_BASED_OUTPATIENT_CLINIC_OR_DEPARTMENT_OTHER): Payer: Self-pay | Admitting: Plastic Surgery

## 2021-11-09 ENCOUNTER — Encounter: Payer: Self-pay | Admitting: Plastic Surgery

## 2021-11-17 ENCOUNTER — Encounter: Payer: Self-pay | Admitting: Plastic Surgery

## 2021-11-17 ENCOUNTER — Other Ambulatory Visit: Payer: Self-pay

## 2021-11-17 ENCOUNTER — Ambulatory Visit (INDEPENDENT_AMBULATORY_CARE_PROVIDER_SITE_OTHER): Payer: BC Managed Care – PPO | Admitting: Plastic Surgery

## 2021-11-17 DIAGNOSIS — N62 Hypertrophy of breast: Secondary | ICD-10-CM

## 2021-11-17 NOTE — Progress Notes (Signed)
The patient is a 41 year old female here for follow-up on her bilateral breast reduction.  Her pain is controlled and she is moving bowels without difficulty.  We were able to remove approximately 680 g from each breast.  She looks incredible.  She has a little bit of bruising and swelling as expected.  I was able to remove the honeycomb dressing.  Continue with the sports bra and follow-up in 2 weeks. ? ?Pictures were obtained of the patient and placed in the chart with the patient's or guardian's permission. ? ?

## 2021-11-30 NOTE — Progress Notes (Signed)
41 year old female here for follow-up after bilateral breast reduction on 11/05/2021.  She is approximately 4 weeks postop. ? ?She is doing really well.  She has some questions about exercising and yoga. ?She also has some sutures that are bothering her. ? ?Chaperone present on exam ?On exam bilateral NAC's are viable, bilateral breast incisions appear intact.  Steri-Strips are still in place.  There is no erythema or cellulitic changes.  No subcutaneous fluid collection noted.  Bilateral breasts are fairly symmetric. ? ?Discussed ongoing restrictions for a few more weeks, discussed continue with compressive garments for a few more weeks.  Avoid strenuous activities.  No heavy lifting.  Recommend removing Steri-Strips over the next few weeks.  Recommend following up in 3 to 4 weeks for reevaluation, discussed with patient if she is doing well she is welcome to cancel that appointment.  Recommend calling with questions or concerns.  Picture was taken and placed in the patient's chart with patient's permission. ?

## 2021-12-01 ENCOUNTER — Other Ambulatory Visit: Payer: Self-pay

## 2021-12-01 ENCOUNTER — Ambulatory Visit (INDEPENDENT_AMBULATORY_CARE_PROVIDER_SITE_OTHER): Payer: BC Managed Care – PPO | Admitting: Surgical

## 2021-12-01 ENCOUNTER — Encounter: Payer: Self-pay | Admitting: Surgical

## 2021-12-01 DIAGNOSIS — N62 Hypertrophy of breast: Secondary | ICD-10-CM

## 2021-12-01 DIAGNOSIS — M546 Pain in thoracic spine: Secondary | ICD-10-CM

## 2021-12-01 DIAGNOSIS — G8929 Other chronic pain: Secondary | ICD-10-CM

## 2021-12-22 ENCOUNTER — Ambulatory Visit: Payer: BC Managed Care – PPO | Admitting: Surgical

## 2021-12-24 ENCOUNTER — Encounter: Payer: Self-pay | Admitting: Family Medicine

## 2021-12-24 ENCOUNTER — Ambulatory Visit (INDEPENDENT_AMBULATORY_CARE_PROVIDER_SITE_OTHER): Payer: BC Managed Care – PPO | Admitting: Family Medicine

## 2021-12-24 VITALS — BP 103/67 | HR 83 | Temp 98.7°F | Ht 60.0 in | Wt 156.0 lb

## 2021-12-24 DIAGNOSIS — N3281 Overactive bladder: Secondary | ICD-10-CM | POA: Diagnosis not present

## 2021-12-24 DIAGNOSIS — R350 Frequency of micturition: Secondary | ICD-10-CM | POA: Diagnosis not present

## 2021-12-24 DIAGNOSIS — E890 Postprocedural hypothyroidism: Secondary | ICD-10-CM | POA: Diagnosis not present

## 2021-12-24 LAB — URINALYSIS, ROUTINE W REFLEX MICROSCOPIC
Bilirubin, UA: NEGATIVE
Glucose, UA: NEGATIVE
Ketones, UA: NEGATIVE
Leukocytes,UA: NEGATIVE
Nitrite, UA: NEGATIVE
Protein,UA: NEGATIVE
RBC, UA: NEGATIVE
Specific Gravity, UA: <1.005 — ABNORMAL LOW (ref 1.005–1.030)
Urobilinogen, Ur: 0.2 mg/dL (ref 0.2–1.0)
pH, UA: 5.5 (ref 5.0–7.5)

## 2021-12-24 MED ORDER — MIRABEGRON ER 25 MG PO TB24: 25.0000 mg | ORAL_TABLET | Freq: Every day | ORAL | 3 refills | Status: DC

## 2021-12-24 MED ORDER — LEVOTHYROXINE SODIUM 112 MCG PO TABS: 112.0000 ug | ORAL_TABLET | Freq: Every day | ORAL | 3 refills | Status: DC

## 2021-12-24 NOTE — Progress Notes (Signed)
?  ? ?Subjective:  ?Patient ID: Melanie Rose, female    DOB: 25-Feb-1981, 41 y.o.   MRN: [id number] ? ?Patient Care Team: ?Janora Norlander, DO as PCP - General (Family Medicine)  ? ?Chief Complaint:  Urinary Frequency ? ? ?HPI: ?Melanie Rose is a 41 y.o. female presenting on 12/24/2021 for Urinary Frequency ? ? ?Pt presents today with complaints of ongoing and worsening urinary frequency. She has had 2 vaginal births and states at times she will have stress incontinence with coughing and sneezing. States she does perform Kegel exercises but not on a daily basis. She denies associated dysuria, hematuria, abdominal or back pain, fever, chills, weakness, or confusion. No vaginal symptoms.  ? ?She also states she needs a refill on her thyroid medications. Labs in Nov 2022 reviewed, TSH and free T4 normal. Denies hyper- or hypothyroid symptoms. ? ? ? ? ?Relevant past medical, surgical, family, and social history reviewed and updated as indicated.  ?Allergies and medications reviewed and updated. Data reviewed: Chart in Epic. ? ? ?Past Medical History:  ?Diagnosis Date  ? Abnormal Pap smear   ? Breast mass in female   ? Left  ? Chronic kidney disease   ? kidney stones  ? Constipation   ? Fibromyalgia   ? Generalized headaches   ? GERD (gastroesophageal reflux disease)   ? Hemorrhoids   ? History of kidney stones   ? 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 Vaginal Pap smear, abnormal   ? ? ?Past Surgical History:  ?Procedure Laterality Date  ? BREAST BIOPSY    ? negative  ? BREAST LUMPECTOMY WITH RADIOACTIVE SEED LOCALIZATION Left 03/07/2018  ? Procedure: LEFT BREAST LUMPECTOMY WITH RADIOACTIVE SEED LOCALIZATION;  Surgeon: Erroll Luna, MD;  Location: [facility];  Service: General;  Laterality: Left;  ? BREAST REDUCTION SURGERY Bilateral 11/05/2021  ? Procedure: BREAST REDUCTION WITH LIPOSUCTION;  Surgeon: Wallace Going, DO;  Location: [facility];  Service: Plastics;  Laterality:  Bilateral;  3 hrs  ? LAPAROSCOPIC TUBAL LIGATION Bilateral 01/16/2015  ? Procedure: LAPAROSCOPIC TUBAL LIGATION;  Surgeon: Newton Pigg, MD;  Location: [facility];  Service: Gynecology;  Laterality: Bilateral;  ? TUBAL LIGATION    ? ? ?Social History  ? ?Socioeconomic History  ? Marital status: Married  ?  Spouse name: Not on file  ? Number of children: Not on file  ? Years of education: Not on file  ? Highest education level: Not on file  ?Occupational History  ? Not on file  ?Tobacco Use  ? Smoking status: Former  ?  Packs/day: 1.00  ?  Years: 10.00  ?  Pack years: 10.00  ?  Types: Cigarettes  ?  Quit date: 06/22/2009  ?  Years since quitting: 12.5  ? Smokeless tobacco: Never  ?Vaping Use  ? Vaping Use: Never used  ?Substance and Sexual Activity  ? Alcohol use: No  ? Drug use: No  ? Sexual activity: Yes  ?  Birth control/protection: Surgical  ?  Comment: tubal  ?Other Topics Concern  ? Not on file  ?Social History Narrative  ? Ms Borgen is a pleasant female who is married and has 2 children.    ? ?Social Determinants of Health  ? ?Financial Resource Strain: Low Risk   ? Difficulty of Paying Living Expenses: Not hard at all  ?Food Insecurity: No Food Insecurity  ? Worried About Charity fundraiser in the Last Year: Never true  ? Ran  Out of Food in the Last Year: Never true  ?Transportation Needs: No Transportation Needs  ? Lack of Transportation (Medical): No  ? Lack of Transportation (Non-Medical): No  ?Physical Activity: Sufficiently Active  ? Days of Exercise per Week: 5 days  ? Minutes of Exercise per Session: 50 min  ?Stress: No Stress Concern Present  ? Feeling of Stress : Not at all  ?Social Connections: Moderately Integrated  ? Frequency of Communication with Friends and Family: Twice a week  ? Frequency of Social Gatherings with Friends and Family: Once a week  ? Attends Religious Services: Never  ? Active Member of Clubs or Organizations: Yes  ? Attends Archivist Meetings: Never  ? Marital Status:  Married  ?Intimate Partner Violence: Not At Risk  ? Fear of Current or Ex-Partner: No  ? Emotionally Abused: No  ? Physically Abused: No  ? Sexually Abused: No  ? ? ?Outpatient Encounter Medications as of 12/24/2021  ?Medication Sig  ? mirabegron ER (MYRBETRIQ) 25 MG TB24 tablet Take 1 tablet (25 mg total) by mouth daily.  ? [DISCONTINUED] levothyroxine (SYNTHROID) 112 MCG tablet Take 1 tablet (112 mcg total) by mouth daily before breakfast.  ? levothyroxine (SYNTHROID) 112 MCG tablet Take 1 tablet (112 mcg total) by mouth daily before breakfast.  ? ?No facility-administered encounter medications on file as of 12/24/2021.  ? ? ?Allergies  ?Allergen Reactions  ? Sulfa Antibiotics Rash  ? ? ?Review of Systems  ?Constitutional:  Negative for activity change, appetite change, chills, diaphoresis, fatigue, fever and unexpected weight change.  ?Respiratory:  Negative for cough and shortness of breath.   ?Cardiovascular:  Negative for chest pain, palpitations and leg swelling.  ?Gastrointestinal:  Negative for abdominal pain.  ?Endocrine: Negative for cold intolerance and heat intolerance.  ?Genitourinary:  Positive for frequency and urgency. Negative for decreased urine volume, difficulty urinating, dyspareunia, dysuria, enuresis, flank pain, genital sores, hematuria, menstrual problem, pelvic pain, vaginal bleeding and vaginal discharge.  ?Musculoskeletal:  Negative for back pain.  ?Neurological:  Negative for dizziness and headaches.  ?Psychiatric/Behavioral:  Negative for confusion.   ?All other systems reviewed and are negative. ? ?   ? ?Objective:  ?BP 103/67   Pulse 83   Temp 98.7 ?F (37.1 ?C)   Ht 5' (1.524 m)   Wt 156 lb (70.8 kg)   LMP 12/03/2021 (Exact Date)   SpO2 97%   BMI 30.47 kg/m?   ? ?Wt Readings from Last 3 Encounters:  ?12/24/21 156 lb (70.8 kg)  ?11/05/21 160 lb 0.9 oz (72.6 kg)  ?10/19/21 160 lb (72.6 kg)  ? ? ?Physical Exam ?Vitals and nursing note reviewed.  ?Constitutional:   ?   General: She  is not in acute distress. ?   Appearance: Normal appearance. She is obese. She is not ill-appearing, toxic-appearing or diaphoretic.  ?HENT:  ?   Head: Normocephalic and atraumatic.  ?Eyes:  ?   Pupils: Pupils are equal, round, and reactive to light.  ?Cardiovascular:  ?   Rate and Rhythm: Regular rhythm.  ?   Heart sounds: Normal heart sounds. No murmur heard. ?  No friction rub. No gallop.  ?Pulmonary:  ?   Effort: Pulmonary effort is normal.  ?   Breath sounds: Normal breath sounds.  ?Abdominal:  ?   General: Bowel sounds are normal.  ?   Palpations: Abdomen is soft.  ?   Tenderness: There is no abdominal tenderness. There is no right CVA tenderness or left CVA  tenderness.  ?Skin: ?   General: Skin is warm and dry.  ?   Capillary Refill: Capillary refill takes less than 2 seconds.  ?Neurological:  ?   General: No focal deficit present.  ?   Mental Status: She is alert and oriented to person, place, and time.  ?Psychiatric:     ?   Mood and Affect: Mood normal.     ?   Behavior: Behavior normal.     ?   Thought Content: Thought content normal.     ?   Judgment: Judgment normal.  ? ? ?Results for orders placed or performed during the hospital encounter of 11/05/21  ?Pregnancy, urine POC  ?Result Value Ref Range  ? Preg Test, Ur NEGATIVE NEGATIVE  ?Surgical pathology  ?Result Value Ref Range  ? SURGICAL PATHOLOGY    ?  SURGICAL PATHOLOGY ?CASE: [id number] ?PATIENT: Cala Sheller ?Surgical Pathology Report ? ? ? ? ?Clinical History: symptomatic mammary hypertrophy (cm) ? ? ? ? ?FINAL MICROSCOPIC DIAGNOSIS: ? ?A. BREAST, RIGHT, MAMMOPLASTY: ?Benign breast with fibrocystic changes including cystic dilatation of ?ducts and stromal fibrosis ?Focal fibromatoid change ?Benign intradermal nevus ?Negative for malignancy ? ?B. BREAST, LEFT, MAMMOPLASTY: ?Benign breast with fibrocystic changes including stromal fibrosis ?Benign intradermal nevus ?Negative for malignancy ? ?GROSS DESCRIPTION: ? ?A: The specimen is received  fresh and consists of a 679 g, 19.2 x 15.0 x ?5.0 cm aggregate of tan-yellow fibroadipose tissue with attached tan ?skin.  Also received in the specimen container is a 14.5 x 14.5 x 0.8 cm ?aggregate of yellow-red liqu

## 2021-12-25 ENCOUNTER — Ambulatory Visit: Payer: BC Managed Care – PPO | Admitting: Family Medicine

## 2021-12-30 ENCOUNTER — Encounter: Payer: Self-pay | Admitting: Family Medicine

## 2021-12-30 ENCOUNTER — Other Ambulatory Visit: Payer: Self-pay | Admitting: Family Medicine

## 2021-12-30 DIAGNOSIS — N3281 Overactive bladder: Secondary | ICD-10-CM

## 2021-12-30 DIAGNOSIS — R350 Frequency of micturition: Secondary | ICD-10-CM

## 2021-12-30 DIAGNOSIS — E890 Postprocedural hypothyroidism: Secondary | ICD-10-CM

## 2021-12-30 MED ORDER — LEVOTHYROXINE SODIUM 112 MCG PO TABS: 112.0000 ug | ORAL_TABLET | Freq: Every day | ORAL | 3 refills | Status: DC

## 2021-12-30 MED ORDER — MIRABEGRON ER 25 MG PO TB24: 25.0000 mg | ORAL_TABLET | Freq: Every day | ORAL | 3 refills | Status: DC

## 2022-01-02 IMAGING — US US BREAST*L* LIMITED INC AXILLA
1 series · 5 of 5 positions shown · non-contrast
Comparison: Previous exam(s).

CLINICAL DATA: 38-year-old female with focal pain and thickening at
the site of LEFT breast excision of fibroadenoma.

EXAM:
DIGITAL DIAGNOSTIC BILATERAL MAMMOGRAM WITH CAD AND TOMO
ULTRASOUND BILATERAL BREAST

[Series 1: us breast*left* limited inc axilla · 0.07mm/px · 5 of 5 slices shown]
[im 1/5]
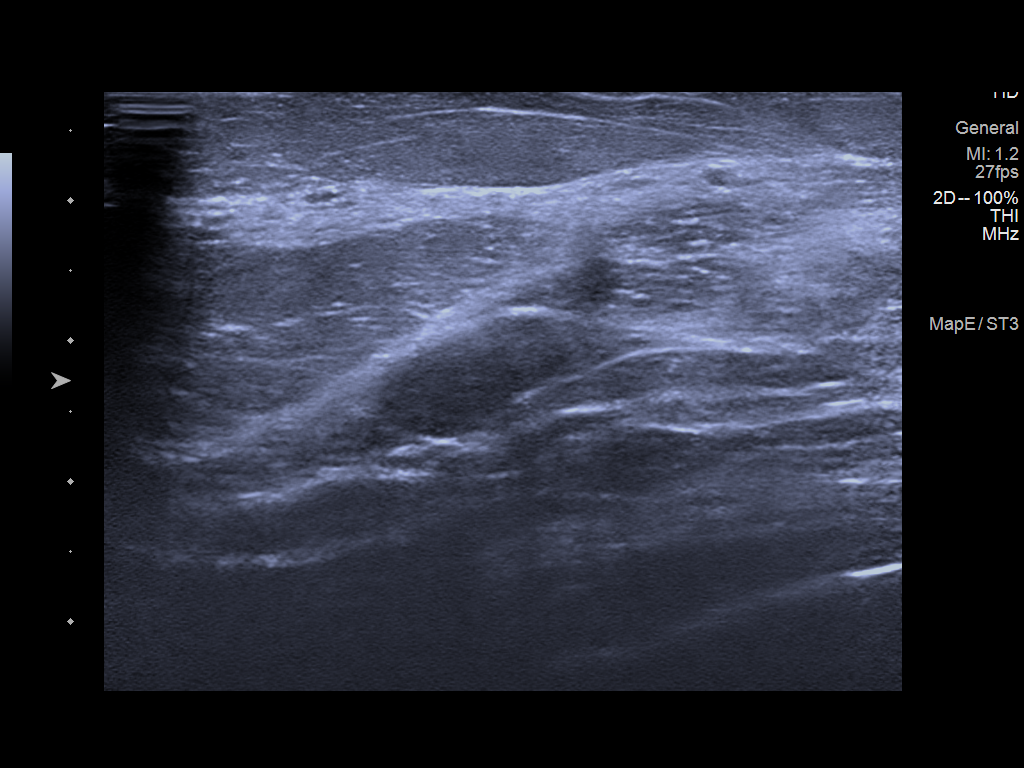
[im 2/5]
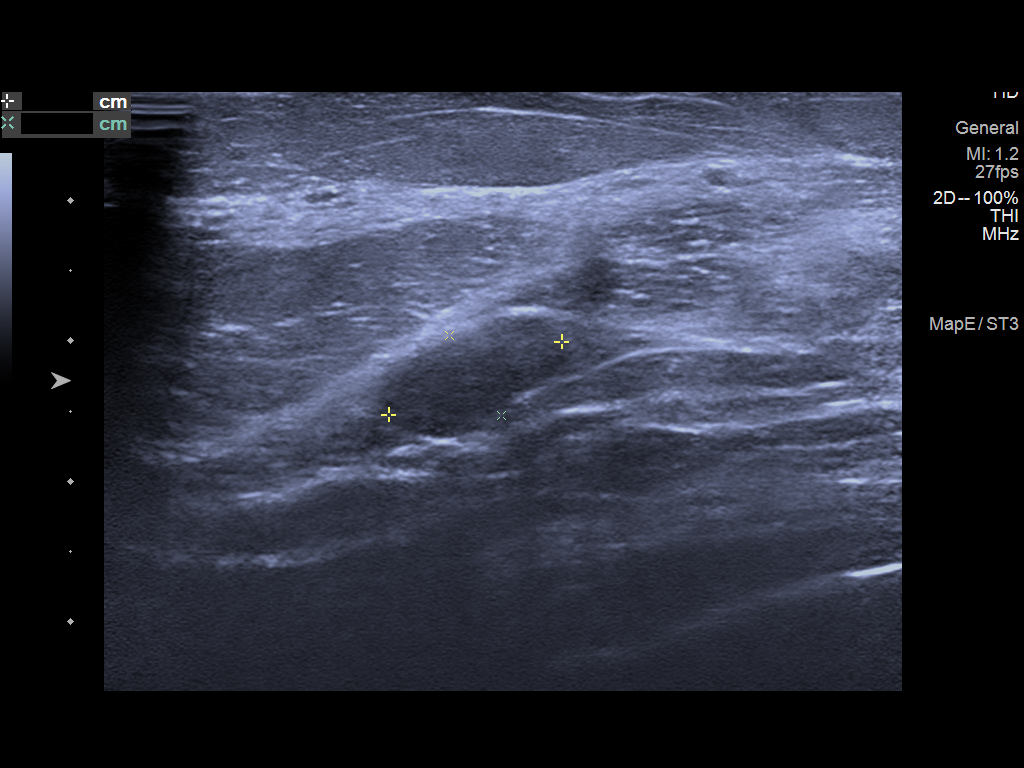
[im 3/5]
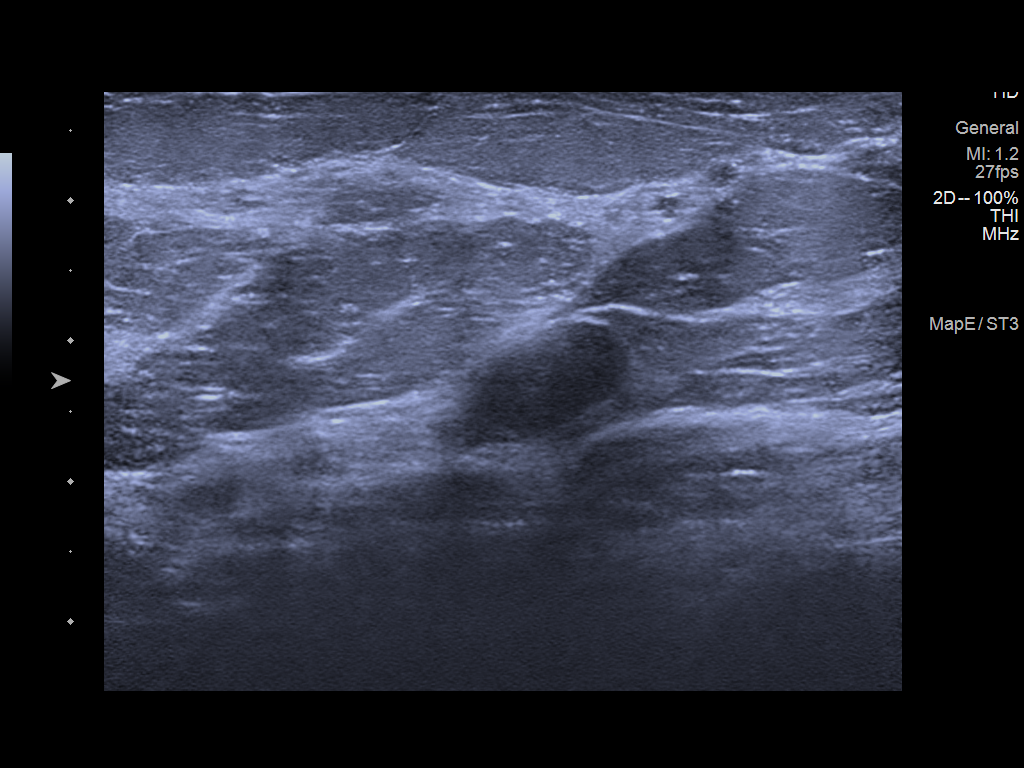
[im 4/5]
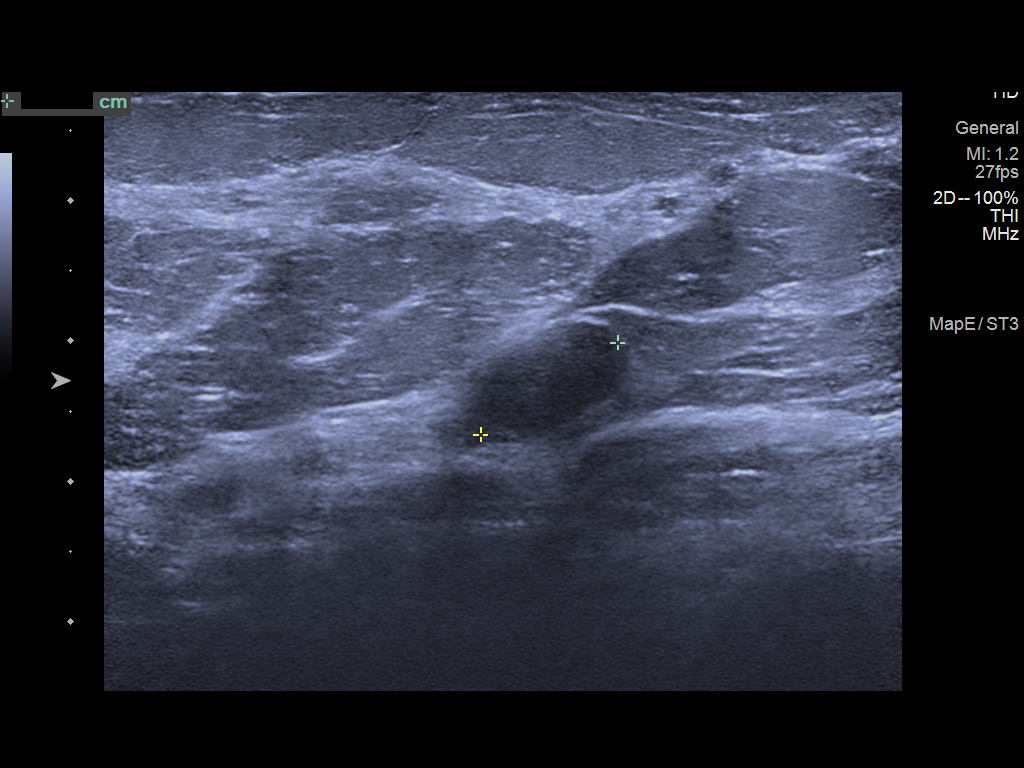
[im 5/5]
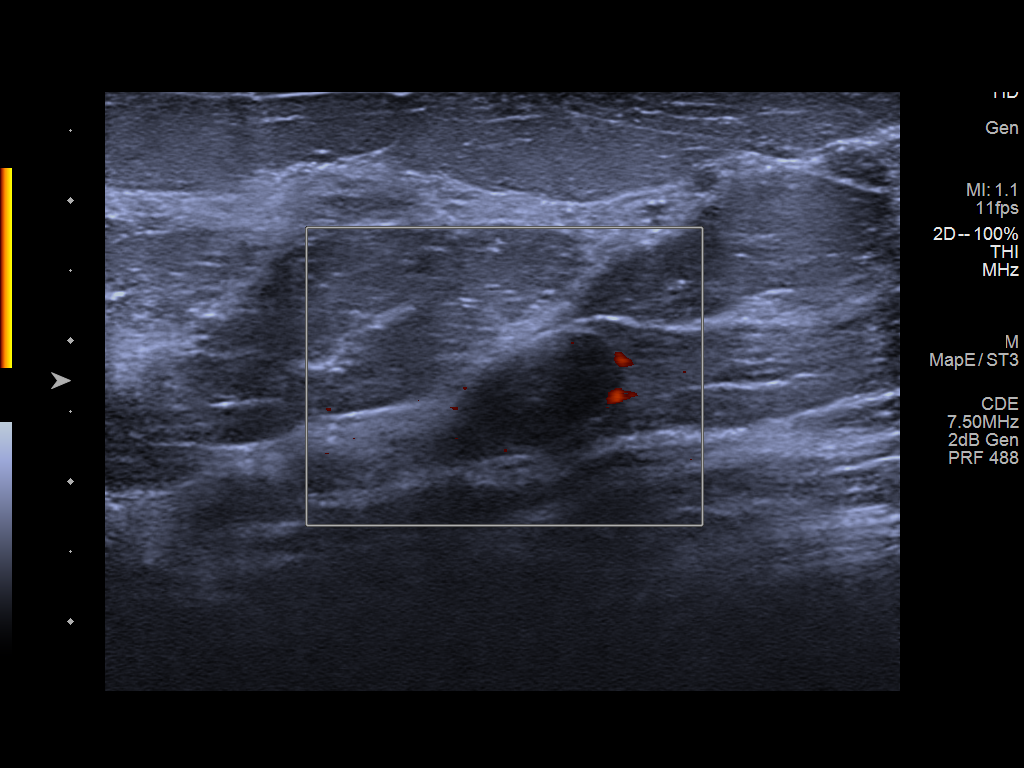

[5 of 5 positions shown; findings below may reference images not displayed]

ACR Breast Density Category b: There are scattered areas of
fibroglandular density.
FINDINGS: 2D/3D full field views of both breasts and spot compression view of
the LEFT breast demonstrate a new 1.3 cm circumscribed oval mass
within the INNER RIGHT breast.

Focal superficial/skin thickening at the LEFT surgical site noted.

No other suspicious mammographic abnormalities noted.

Mammographic images were processed with CAD.

On physical exam, hypertrophic changes/keloid at the UPPER OUTER
LEFT breast scar noted.

Targeted ultrasound is performed, showing a 1.3 x 0.7 x 1.2 cm
circumscribed oval hypoechoic parallel mass at the 2 o'clock
position of the RIGHT breast 6 cm from the nipple, best seen on the
harmonic images. This likely represents a fibroadenoma.

No suspicious abnormalities are identified within the UPPER-OUTER
LEFT breast underlying the patient's scar.
IMPRESSION: 1. New 1.3 cm likely benign mass/probable fibroadenoma within the
UPPER INNER RIGHT breast. We discussed management options including
excision, ultrasound-guided core biopsy, and close follow-up.
Follow-up ultrasound is recommended at 6, 12,and 24 months to assess
stability. The patient concurs with this plan.
2. Keloid type changes at the UPPER-OUTER LEFT breast scar. No
suspicious underlying abnormalities.

RECOMMENDATION:
RIGHT breast ultrasound in 6 months.

I have discussed the findings and recommendations with the patient.
If applicable, a reminder letter will be sent to the patient
regarding the next appointment.

BI-RADS CATEGORY  3: Probably benign.

## 2022-01-02 IMAGING — MG DIGITAL DIAGNOSTIC BILAT W/ TOMO W/ CAD
6 of 10 series · 6 of 30 positions shown · non-contrast
Comparison: Previous exam(s).

CLINICAL DATA: 38-year-old female with focal pain and thickening at
the site of LEFT breast excision of fibroadenoma.

EXAM:
DIGITAL DIAGNOSTIC BILATERAL MAMMOGRAM WITH CAD AND TOMO
ULTRASOUND BILATERAL BREAST

[L MLO synth-2D]
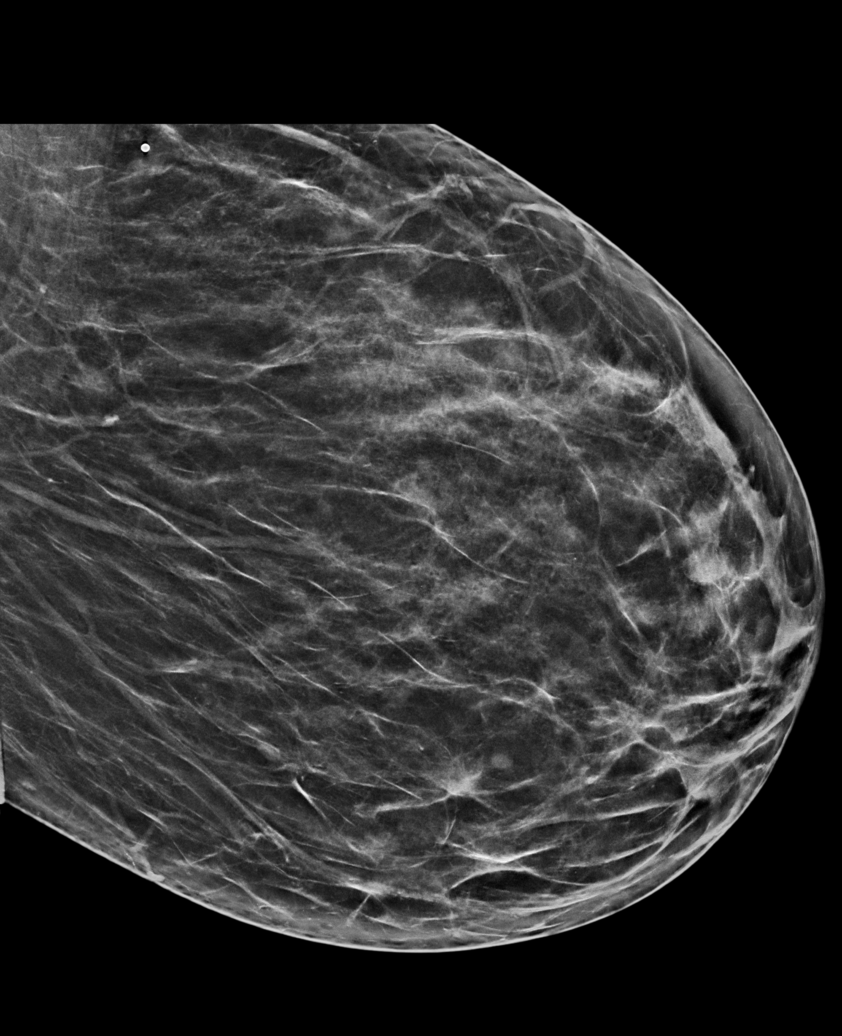

[R MLO synth-2D]
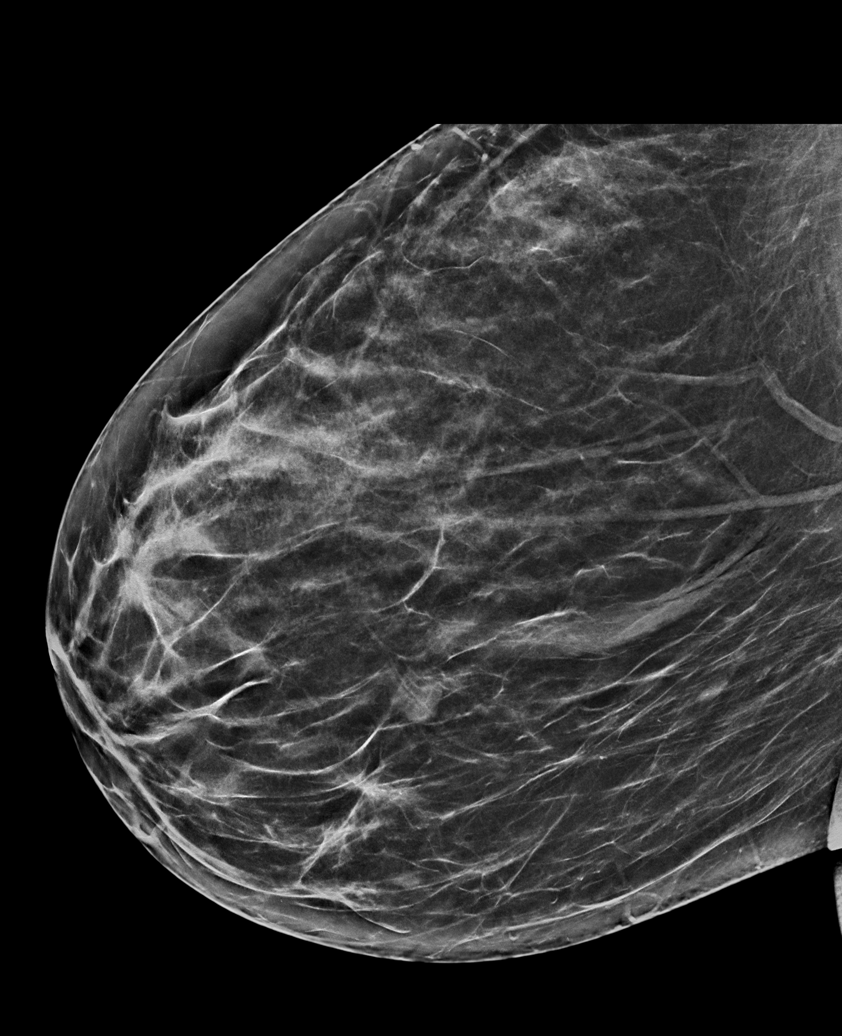

[L CC synth-2D]
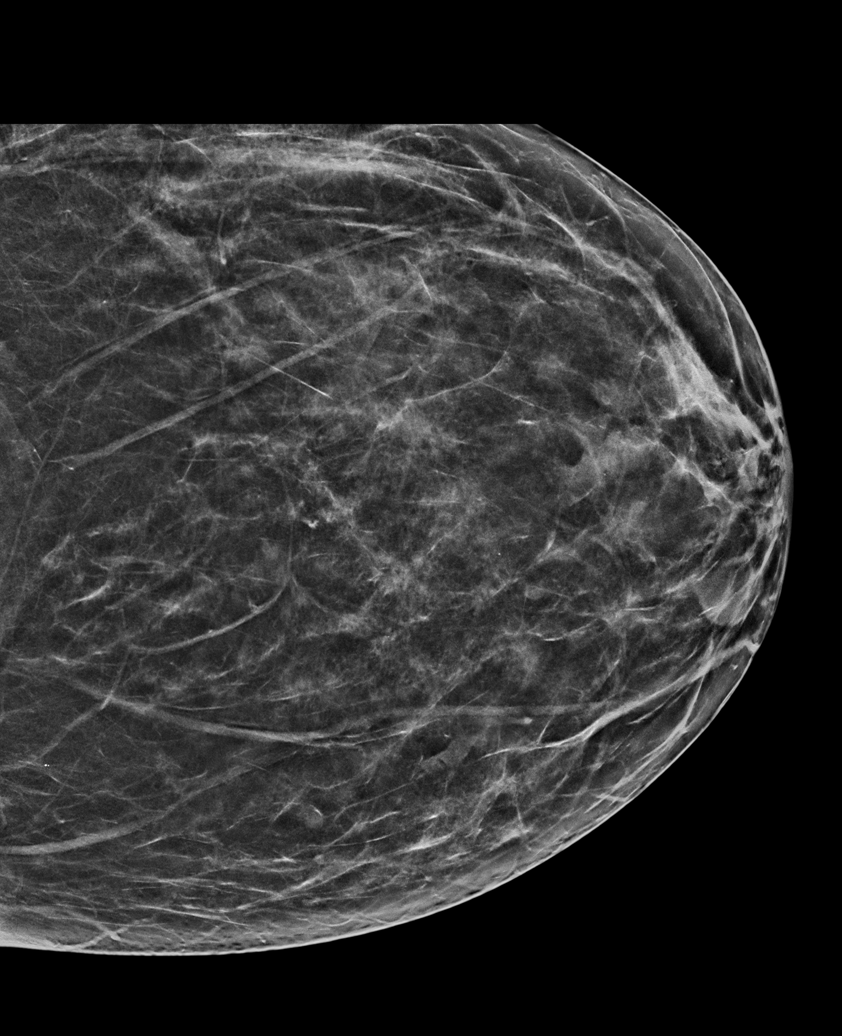

[L TAN synth-2D]
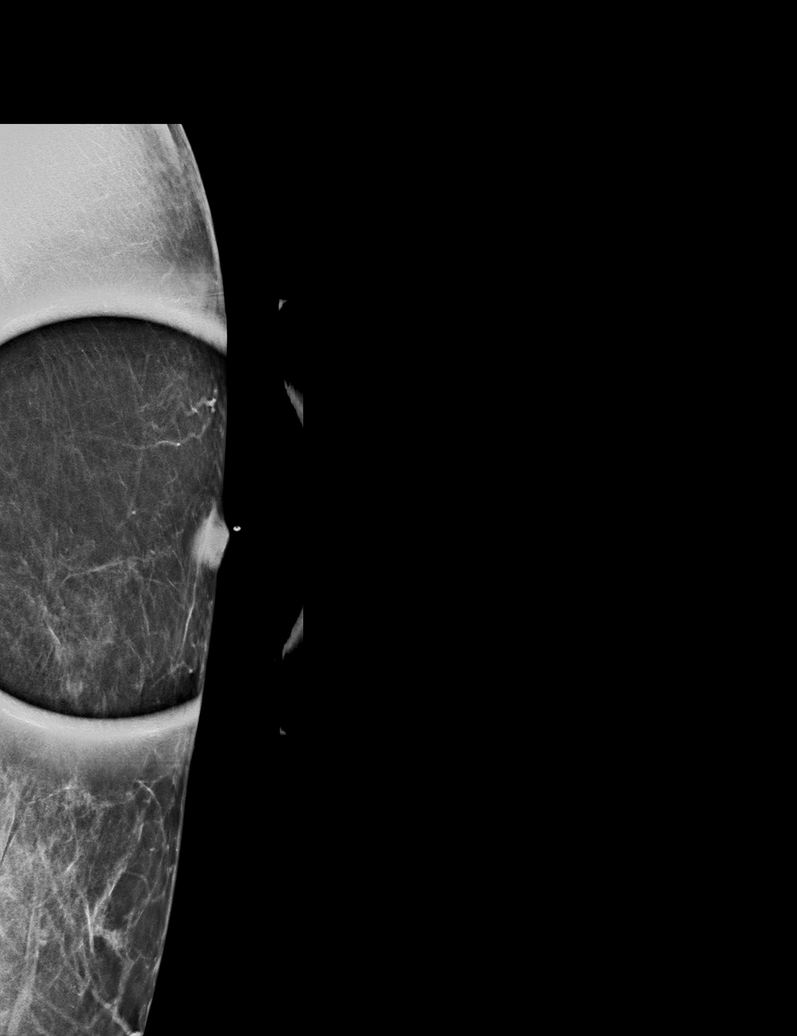

[R CC synth-2D]
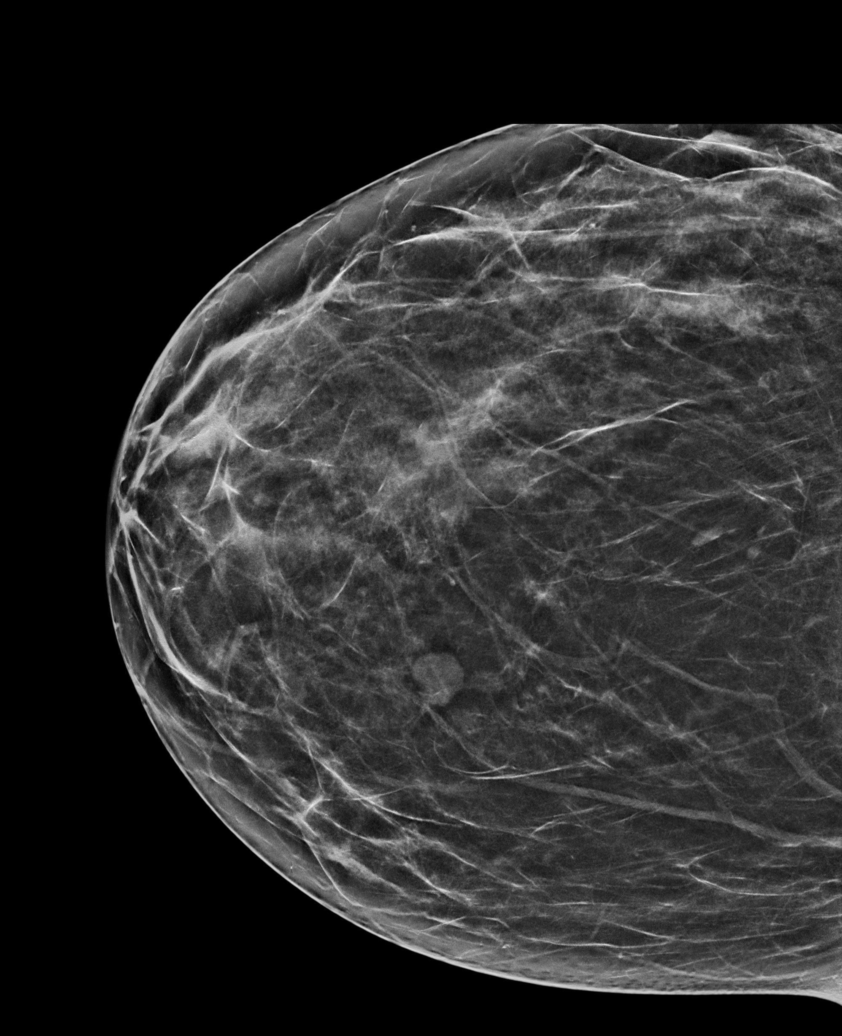

[R CC tomo · tomo slice 34/67.0]
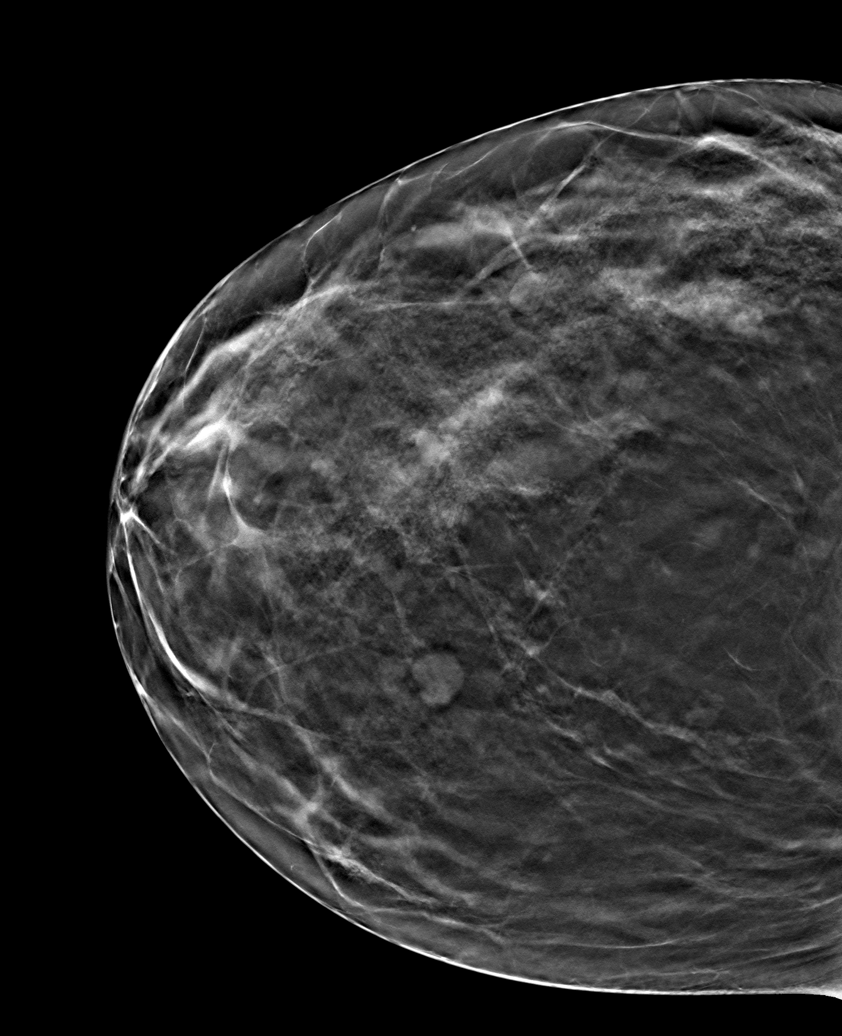

[6 of 30 positions shown; findings below may reference images not displayed]

ACR Breast Density Category b: There are scattered areas of
fibroglandular density.
FINDINGS: 2D/3D full field views of both breasts and spot compression view of
the LEFT breast demonstrate a new 1.3 cm circumscribed oval mass
within the INNER RIGHT breast.

Focal superficial/skin thickening at the LEFT surgical site noted.

No other suspicious mammographic abnormalities noted.

Mammographic images were processed with CAD.

On physical exam, hypertrophic changes/keloid at the UPPER OUTER
LEFT breast scar noted.

Targeted ultrasound is performed, showing a 1.3 x 0.7 x 1.2 cm
circumscribed oval hypoechoic parallel mass at the 2 o'clock
position of the RIGHT breast 6 cm from the nipple, best seen on the
harmonic images. This likely represents a fibroadenoma.

No suspicious abnormalities are identified within the UPPER-OUTER
LEFT breast underlying the patient's scar.
IMPRESSION: 1. New 1.3 cm likely benign mass/probable fibroadenoma within the
UPPER INNER RIGHT breast. We discussed management options including
excision, ultrasound-guided core biopsy, and close follow-up.
Follow-up ultrasound is recommended at 6, 12,and 24 months to assess
stability. The patient concurs with this plan.
2. Keloid type changes at the UPPER-OUTER LEFT breast scar. No
suspicious underlying abnormalities.

RECOMMENDATION:
RIGHT breast ultrasound in 6 months.

I have discussed the findings and recommendations with the patient.
If applicable, a reminder letter will be sent to the patient
regarding the next appointment.

BI-RADS CATEGORY  3: Probably benign.

## 2022-01-22 ENCOUNTER — Ambulatory Visit: Payer: BC Managed Care – PPO | Admitting: Family Medicine

## 2022-06-24 ENCOUNTER — Ambulatory Visit (INDEPENDENT_AMBULATORY_CARE_PROVIDER_SITE_OTHER): Payer: BC Managed Care – PPO

## 2022-06-24 DIAGNOSIS — Z23 Encounter for immunization: Secondary | ICD-10-CM | POA: Diagnosis not present

## 2022-06-24 NOTE — Progress Notes (Signed)
Patient here today for tetanus booster and influenza immunization.  Vaccines given and documented.

## 2022-07-21 ENCOUNTER — Encounter: Payer: Self-pay | Admitting: Family Medicine

## 2022-07-21 ENCOUNTER — Encounter: Payer: Self-pay | Admitting: Adult Health

## 2022-07-21 ENCOUNTER — Other Ambulatory Visit (HOSPITAL_COMMUNITY)
Admission: RE | Admit: 2022-07-21 | Discharge: 2022-07-21 | Disposition: A | Discharge status: Home / Self Care | Payer: BC Managed Care – PPO | Source: Ambulatory Visit | Attending: Adult Health | Admitting: Adult Health

## 2022-07-21 ENCOUNTER — Ambulatory Visit (INDEPENDENT_AMBULATORY_CARE_PROVIDER_SITE_OTHER): Payer: BC Managed Care – PPO | Admitting: Family Medicine

## 2022-07-21 ENCOUNTER — Encounter: Payer: BC Managed Care – PPO | Admitting: Family Medicine

## 2022-07-21 ENCOUNTER — Ambulatory Visit (INDEPENDENT_AMBULATORY_CARE_PROVIDER_SITE_OTHER): Payer: BC Managed Care – PPO | Admitting: Adult Health

## 2022-07-21 VITALS — BP 105/71 | HR 76 | Ht 60.0 in | Wt 152.0 lb

## 2022-07-21 VITALS — BP 112/68 | HR 79 | Temp 97.9°F | Ht 60.0 in | Wt 151.2 lb

## 2022-07-21 DIAGNOSIS — N3281 Overactive bladder: Secondary | ICD-10-CM | POA: Diagnosis not present

## 2022-07-21 DIAGNOSIS — Z0001 Encounter for general adult medical examination with abnormal findings: Secondary | ICD-10-CM | POA: Diagnosis not present

## 2022-07-21 DIAGNOSIS — E559 Vitamin D deficiency, unspecified: Secondary | ICD-10-CM

## 2022-07-21 DIAGNOSIS — Z01419 Encounter for gynecological examination (general) (routine) without abnormal findings: Secondary | ICD-10-CM | POA: Diagnosis not present

## 2022-07-21 DIAGNOSIS — Z0000 Encounter for general adult medical examination without abnormal findings: Secondary | ICD-10-CM

## 2022-07-21 DIAGNOSIS — Z124 Encounter for screening for malignant neoplasm of cervix: Secondary | ICD-10-CM | POA: Diagnosis not present

## 2022-07-21 DIAGNOSIS — Z130 Encounter for screening for diseases of the blood and blood-forming organs and certain disorders involving the immune mechanism: Secondary | ICD-10-CM | POA: Diagnosis not present

## 2022-07-21 DIAGNOSIS — E7800 Pure hypercholesterolemia, unspecified: Secondary | ICD-10-CM

## 2022-07-21 DIAGNOSIS — E890 Postprocedural hypothyroidism: Secondary | ICD-10-CM | POA: Diagnosis not present

## 2022-07-21 DIAGNOSIS — M545 Low back pain, unspecified: Secondary | ICD-10-CM

## 2022-07-21 DIAGNOSIS — G8929 Other chronic pain: Secondary | ICD-10-CM

## 2022-07-21 MED ORDER — LEVOTHYROXINE SODIUM 112 MCG PO TABS: 112.0000 ug | ORAL_TABLET | Freq: Every day | ORAL | 3 refills | Status: DC

## 2022-07-21 MED ORDER — MIRABEGRON ER 25 MG PO TB24: 25.0000 mg | ORAL_TABLET | Freq: Every day | ORAL | 3 refills | Status: DC

## 2022-07-21 NOTE — Patient Instructions (Addendum)
Text MSAVE to [phone]  Preventive Care 39-39 Years Old, Female Preventive care refers to lifestyle choices and visits with your health care provider that can promote health and wellness. Preventive care visits are also called wellness exams. What can I expect for my preventive care visit? Counseling During your preventive care visit, your health care provider may ask about your: Medical history, including: Past medical problems. Family medical history. Pregnancy history. Current health, including: Menstrual cycle. Method of birth control. Emotional well-being. Home life and relationship well-being. Sexual activity and sexual health. Lifestyle, including: Alcohol, nicotine or tobacco, and drug use. Access to firearms. Diet, exercise, and sleep habits. Work and work Statistician. Sunscreen use. Safety issues such as seatbelt and bike helmet use. Physical exam Your health care provider may check your: Height and weight. These may be used to calculate your BMI (body mass index). BMI is a measurement that tells if you are at a healthy weight. Waist circumference. This measures the distance around your waistline. This measurement also tells if you are at a healthy weight and may help predict your risk of certain diseases, such as type 2 diabetes and high blood pressure. Heart rate and blood pressure. Body temperature. Skin for abnormal spots. What immunizations do I need?  Vaccines are usually given at various ages, according to a schedule. Your health care provider will recommend vaccines for you based on your age, medical history, and lifestyle or other factors, such as travel or where you work. What tests do I need? Screening Your health care provider may recommend screening tests for certain conditions. This may include: Pelvic exam and Pap test. Lipid and cholesterol levels. Diabetes screening. This is done by checking your blood sugar (glucose) after you have not eaten for a while  (fasting). Hepatitis B test. Hepatitis C test. HIV (human immunodeficiency virus) test. STI (sexually transmitted infection) testing, if you are at risk. BRCA-related cancer screening. This may be done if you have a family history of breast, ovarian, tubal, or peritoneal cancers. Talk with your health care provider about your test results, treatment options, and if necessary, the need for more tests. Follow these instructions at home: Eating and drinking  Eat a healthy diet that includes fresh fruits and vegetables, whole grains, lean protein, and low-fat dairy products. Take vitamin and mineral supplements as recommended by your health care provider. Do not drink alcohol if: Your health care provider tells you not to drink. You are pregnant, may be pregnant, or are planning to become pregnant. If you drink alcohol: Limit how much you have to 0-1 drink a day. Know how much alcohol is in your drink. In the [place] one drink equals one 12 oz bottle of beer (355 mL), one 5 oz glass of wine (148 mL), or one 1 oz glass of hard liquor (44 mL). Lifestyle Brush your teeth every morning and night with fluoride toothpaste. Floss one time each day. Exercise for at least 30 minutes 5 or more days each week. Do not use any products that contain nicotine or tobacco. These products include cigarettes, chewing tobacco, and vaping devices, such as e-cigarettes. If you need help quitting, ask your health care provider. Do not use drugs. If you are sexually active, practice safe sex. Use a condom or other form of protection to prevent STIs. If you do not wish to become pregnant, use a form of birth control. If you plan to become pregnant, see your health care provider for a prepregnancy visit. Find healthy ways to  stress, such as: Meditation, yoga, or listening to music. Journaling. Talking to a trusted person. Spending time with friends and family. Minimize exposure to UV radiation to reduce your  risk of skin cancer. Safety Always wear your seat belt while driving or riding in a vehicle. Do not drive: If you have been drinking alcohol. Do not ride with someone who has been drinking. If you have been using any mind-altering substances or drugs. While texting. When you are tired or distracted. Wear a helmet and other protective equipment during sports activities. If you have firearms in your house, make sure you follow all gun safety procedures. Seek help if you have been physically or sexually abused. What's next? Go to your health care provider once a year for an annual wellness visit. Ask your health care provider how often you should have your eyes and teeth checked. Stay up to date on all vaccines. This information is not intended to replace advice given to you by your health care provider. Make sure you discuss any questions you have with your health care provider. Document Revised: 02/18/2021 Document Reviewed: 02/18/2021 Elsevier Patient Education  2023 Elsevier Inc.  

## 2022-07-21 NOTE — Progress Notes (Signed)
Patient ID: Melanie Rose, female   DOB: Aug 20, 1981, 41 y.o.   MRN: [id number] History of Present Illness: Melanie Rose is a 41 year old white female, married, G2P2002, in for GYN exam and pap. She had physical and labs with PCP this am.  PCP is Dr Lajuana Ripple.   Current Medications, Allergies, Past Medical History, Past Surgical History, Family History and Social History were reviewed in [employer] record.     Review of Systems:  Patient denies any headaches, hearing loss, fatigue, blurred vision, shortness of breath, chest pain, abdominal pain, problems with bowel movements, or intercourse. No joint pain or mood swings.  Has UI on myrbetric and that helps.   Physical Exam:BP 105/71 (BP Location: Left Arm, Patient Position: Sitting, Cuff Size: Normal)   Pulse 76   Ht 5' (1.524 m)   Wt 152 lb (68.9 kg)   LMP 07/15/2022   BMI 29.69 kg/m   General:  Well developed, well nourished, no acute distress Skin:  Warm and dry Neck:  Midline trachea, normal thyroid, good ROM, no lymphadenopathy Lungs; Clear to auscultation bilaterally Breast:  No dominant palpable mass, retraction, or nipple discharge,sp breat reduction march 2023 and feels much better. Cardiovascular: Regular rate and rhythm Abdomen:  Soft, non tender, no hepatosplenomegaly Pelvic:  External genitalia is normal in appearance, no lesions.  The vagina is normal in appearance. Urethra has no lesions or masses. The cervix is smooth, pap with HR HPV genotyping performed.  Uterus is felt to be normal size, shape, and contour.  No adnexal masses or tenderness noted.Bladder is non tender, no masses felt. Rectal:Deferred Extremities/musculoskeletal:  No swelling or varicosities noted, no clubbing or cyanosis Psych:  No mood changes, alert and cooperative,seems happy AA is 0 Fall risk is low    07/21/2022   10:32 AM 07/21/2022    8:08 AM 12/24/2021   10:31 AM  Depression screen PHQ 2/9  Decreased Interest 0 0 0   Down, Depressed, Hopeless 0 0 0  PHQ - 2 Score 0 0 0  Altered sleeping 0  0  Tired, decreased energy 0  0  Change in appetite 0  0  Feeling bad or failure about yourself  0  0  Trouble concentrating 0  0  Moving slowly or fidgety/restless 0  0  Suicidal thoughts 0  0  PHQ-9 Score 0  0  Difficult doing work/chores   Not difficult at all       07/21/2022   10:33 AM 07/21/2022    8:09 AM 12/24/2021   10:32 AM 07/20/2021    8:01 AM  GAD 7 : Generalized Anxiety Score  Nervous, Anxious, on Edge 0 0 0 0  Control/stop worrying 0 0 0 0  Worry too much - different things 0 0 0 0  Trouble relaxing 0 0 0 0  Restless 0 0 0 0  Easily annoyed or irritable 0 0 0 0  Afraid - awful might happen 0 0 0 0  Total GAD 7 Score 0 0 0 0  Anxiety Difficulty  Not difficult at all Not difficult at all Not difficult at all      Upstream - 07/21/22 1038       Pregnancy Intention Screening   Does the patient want to become pregnant in the next year? N/A    Does the patient's partner want to become pregnant in the next year? N/A    Would the patient like to discuss contraceptive options today? No  Contraception Wrap Up   Current Method Female Sterilization    End Method Female Sterilization    Contraception Counseling Provided No             Examination chaperoned by Dewitt Hoes RN  Impression and plan: 1. Encounter for gynecological examination with Papanicolaou smear of cervix Pap sent Pap in 3 years if normal GYN exam in 1 year Labs with PCP Stay active

## 2022-07-21 NOTE — Progress Notes (Signed)
I have separately seen and examined the patient. I have discussed the findings and exam with student Dr Jerline Pain and agree with the below note.  My changes/additions are outlined in BLUE.    S: Patient reports overall she is doing fairly well.  She continues to try and lose weight with healthy lifestyle.  She has been successful with over 15 pound weight loss since her journey began but feels like she has plateaued.  She wonders if her thyroid has anything to do with this.  Compliant with medications.  Reports no difficulty swallowing, change in voice, tremor, change in bowel habits or energy.  No chest pain, shortness of breath, vaginal discharge, abnormal vaginal bleeding.  No rectal bleeding no hematuria, skin lesions of concern.  No balance issues.  Mood is well controlled.  O: Vitals:   07/21/22 0753  BP: 112/68  Pulse: 79  Temp: 97.9 F (36.6 C)  SpO2: 98%    General appearance: alert, cooperative, appears stated age, and no distress Head: Normocephalic, without obvious abnormality, atraumatic Eyes: negative findings: lids and lashes normal, conjunctivae and sclerae normal, corneas clear, and pupils equal, round, reactive to light and accomodation Ears: normal TM's and external ear canals both ears Nose: Nares normal. Septum midline. Mucosa normal. No drainage or sinus tenderness. Throat: lips, mucosa, and tongue normal; teeth and gums normal Neck: no adenopathy, supple, symmetrical, trachea midline, and thyroid not enlarged, symmetric, no tenderness/mass/nodules Back: symmetric, no curvature. ROM normal. No CVA tenderness. Lungs: clear to auscultation bilaterally Heart: regular rate and rhythm, S1, S2 normal, no murmur, click, rub or gallop Abdomen: soft, non-tender; bowel sounds normal; no masses,  no organomegaly Extremities: extremities normal, atraumatic, no cyanosis or edema Pulses: 2+ and symmetric Skin: Skin color, texture, turgor normal. No rashes or lesions Lymph nodes:  Cervical, supraclavicular, and axillary nodes normal. Neurologic: Grossly normal Psych: Mood stable, speech normal, affect appropriate.  Very pleasant and interactive  Flowsheet Row Office Visit from 07/21/2022 in [place]  PHQ-2 Total Score 0         A/P:  Annual physical exam  Hypothyroidism following radioiodine therapy - Plan: CMP14+EGFR, TSH, T4, Free, levothyroxine (SYNTHROID) 112 MCG tablet  Vitamin D deficiency - Plan: CMP14+EGFR, VITAMIN D 25 Hydroxy (Vit-D Deficiency, Fractures)  OAB (overactive bladder) - Plan: mirabegron ER (MYRBETRIQ) 25 MG TB24 tablet  Pure hypercholesterolemia - Plan: CMP14+EGFR, Lipid Panel  Screening, anemia, deficiency, iron - Plan: CBC  Chronic left-sided low back pain without sciatica  Up-to-date on preventative health care.  Check thyroid levels.  Thyroid medications have been renewed.  Continues to struggle with trying to lose weight and I suspect that much of this has to do with metabolic issues as she really does not need a healthy lifestyle.  Given history of vitamin D deficiency, check the level  Myrbetriq working well for overactive bladder.  I gave her information on how to get a discount through the manufacturer on this  Fasting lipid, CMP ordered.  Check screening CBC as well  She noted some left-sided back pain that is fairly regularly present but intermittent in nature.  I offered oral NSAID but she would like to hold off on this for now.  Not having any radicular symptoms or red flag signs or symptoms.  Geanine Vandekamp M. Lajuana Ripple, [facility]   -------------------------------------------------------------------------------------------------------------------------------------------------------------------------------------       [name] is a 41 y.o. female presents to office today for annual physical exam examination.  In general, things are going well. Patient  has undergone significant weight loss and a breast reduction surgery in the past year. She maintains a regular exercise routine (6 hours per week) and follows a diet abundant in fruits and vegetables.  Concerns today include: Hypothyroidism Patient reports that she is compliant with her Synthyroid medication. Denies easy fatigability, weight loss, dry or brittle skin, cold sensitivity. Also denies rapid heart beat, unintentional weight loss, excessive sweating, and heat intolerance.   Stress urinary incontinence  Urgency urinary incontinence No concerns at this time. The patient indicates that Mirabegron has substantially alleviated her urinary incontinence symptoms. She occasionally experiences frequency and urgency, but stress incontinence symptoms have been resolved.  Occupation: SAHM, Marital status: married, Substance use: none, former smoker Diet: greatly improved from last year increased vegetables etc. , Exercise: exercises 6 times per week Last eye exam: Needs Last dental exam: UTD Last colonoscopy: Doesn't need Last mammogram: 06/09/2021  Last pap smear: one year ago, abnormal at one point. Getting her yearly pap with OBGYN today Refills needed today: none Immunizations needed: Immunization History  Administered Date(s) Administered   Influenza,inj,Quad PF,6+ Mos 06/19/2018, 05/30/2019, 06/23/2020, 07/20/2021, 06/24/2022   Influenza,trivalent, recombinat, inj, PF 07/03/2013   Influenza-Unspecified 07/03/2013   Moderna Sars-Covid-2 Vaccination 11/29/2019, 01/02/2020, 07/22/2020   Td 06/24/2022   Tdap 06/24/2011     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   Vaginal Pap smear,  abnormal    Social History   Socioeconomic History   Marital status: Married    Spouse name: Not on file   Number of children: Not on file   Years of education: Not on file   Highest education level: Not on file  Occupational History   Not on file  Tobacco Use   Smoking status: Former    Packs/day: 1.00    Years: 10.00    Total pack years: 10.00    Types: Cigarettes    Quit date: 06/22/2009    Years since quitting: 13.0   Smokeless tobacco: Never  Vaping Use   Vaping Use: Never used  Substance and Sexual Activity   Alcohol use: No   Drug use: No   Sexual activity: Yes    Birth control/protection: Surgical    Comment: tubal  Other Topics Concern   Not on file  Social History Narrative   Ms Gutknecht is a pleasant female who is married and has 2 children.     Social Determinants of Health   Financial Resource Strain: Low Risk  (05/14/2021)   Overall Financial Resource Strain (CARDIA)    Difficulty of Paying Living Expenses: Not hard at all  Food Insecurity: No Food Insecurity (05/14/2021)   Hunger Vital Sign    Worried About Running Out of Food in the Last Year: Never true    Ran Out of Food in the Last Year: Never true  Transportation Needs: No Transportation Needs (05/14/2021)   PRAPARE - Hydrologist (Medical): No    Lack of Transportation (Non-Medical): No  Physical Activity: Sufficiently Active (05/14/2021)   Exercise Vital Sign    Days of Exercise  per Week: 5 days    Minutes of Exercise per Session: 50 min  Stress: No Stress Concern Present (05/14/2021)   [place]    Feeling of Stress : Not at all  Social Connections: Moderately Integrated (05/14/2021)   Social Connection and Isolation Panel [NHANES]    Frequency of Communication with Friends and Family: Twice a week    Frequency of Social Gatherings with Friends and Family: Once a week    Attends Religious Services: Never     Marine scientist or Organizations: Yes    Attends Archivist Meetings: Never    Marital Status: Married  Human resources officer Violence: Not At Risk (05/14/2021)   Humiliation, Afraid, Rape, and Kick questionnaire    Fear of Current or Ex-Partner: No    Emotionally Abused: No    Physically Abused: No    Sexually Abused: No   Past Surgical History:  Procedure Laterality Date   BREAST BIOPSY     negative   BREAST LUMPECTOMY WITH RADIOACTIVE SEED LOCALIZATION Left 03/07/2018   Procedure: LEFT BREAST LUMPECTOMY WITH RADIOACTIVE SEED LOCALIZATION;  Surgeon: Erroll Luna, MD;  Location: [place];  Service: General;  Laterality: Left;   BREAST REDUCTION SURGERY Bilateral 11/05/2021   Procedure: BREAST REDUCTION WITH LIPOSUCTION;  Surgeon: Wallace Going, DO;  Location: [place];  Service: Plastics;  Laterality: Bilateral;  3 hrs   LAPAROSCOPIC TUBAL LIGATION Bilateral 01/16/2015   Procedure: LAPAROSCOPIC TUBAL LIGATION;  Surgeon: Newton Pigg, MD;  Location: [facility];  Service: Gynecology;  Laterality: Bilateral;   TUBAL LIGATION     Family History  Problem Relation Age of Onset   Hypertension Mother    Hypertension Father    Heart disease Maternal Grandfather    Cancer Paternal Grandfather     Current Outpatient Medications:    levothyroxine (SYNTHROID) 112 MCG tablet, Take 1 tablet (112 mcg total) by mouth daily before breakfast., Disp: 90 tablet, Rfl: 3   mirabegron ER (MYRBETRIQ) 25 MG TB24 tablet, Take 1 tablet (25 mg total) by mouth daily., Disp: 90 tablet, Rfl: 3  Allergies  Allergen Reactions   Sulfa Antibiotics Rash     ROS: Review of Systems Review of Systems  Constitutional:  Negative for activity change, appetite change, chills, diaphoresis, fatigue, fever and unexpected weight change.  HENT:  Negative for congestion, ear pain, facial swelling, sneezing and sore throat.   Respiratory:  Negative for cough, choking, chest tightness and  shortness of breath.   Cardiovascular:  Negative for chest pain, palpitations and leg swelling.  Gastrointestinal:  Negative for blood in stool, constipation, diarrhea and nausea.  Endocrine: Negative for cold intolerance and heat intolerance.  Genitourinary:  Negative for difficulty urinating, dysuria, or urgency  Musculoskeletal:  Negative for arthralgias and back pain.  Skin:  Negative for color change and pallor.  Neurological:  Negative for dizziness, numbness and headaches.    Physical exam Physical Examination:  General: Awake, alert, well nourished, No acute distress HEENT: Normal    Neck: No masses palpated. No lymphadenopathy    Ears: Tympanic membranes intact, normal light reflex, no erythema, no bulging    Eyes: PERRLA, extraocular membranes intact, sclera white    Nose: nasal turbinates moist, no nasal discharge    Throat: moist mucus membranes, no erythema, no tonsillar exudate.  Airway is patent Cardio: regular rate and rhythm, S1S2 heard, no murmurs appreciated Pulm: clear to auscultation bilaterally, no wheezes, rhonchi or  rales; normal work of breathing on room air GI: soft, non-tender, non-distended, bowel sounds present x4, no hepatomegaly, no splenomegaly, no masses Extremities: warm, well perfused, No edema, cyanosis or clubbing; +1 pulses bilaterally Skin: dry; intact; no rashes or lesions  Assessment/ Plan: COLLENE MASSIMINO is a 41 year old female with a history of hypothyroidism and urinary incontinence here for annual physical exam.   No concerns at this time. The patient indicates that Mirabegron has substantially alleviated her urinary incontinence symptoms. She occasionally experiences frequency and urgency, but stress incontinence symptoms have been resolved.  She has undergone significant intentional weight loss and a breast reduction surgery in the past year. She maintains a regular exercise routine (6 hours per week) and follows a diet rich in fruits and  vegetables. Discussed that hypothyroidism can indeed hinder weight loss efforts.   Reports low back pain with some radiation of pain down the lateral right thigh. Reports weekly flare-ups that she is managing without pain medications. Denies anti-inflammatory medications but will follow-up if symptoms worsen.   Counseled on healthy lifestyle choices, including diet (rich in fruits, vegetables and lean meats and low in salt and simple carbohydrates) and exercise (at least 30 minutes of moderate physical activity daily).  Patient to follow up in 1 year for annual exam or sooner if needed.  Stephani Police, MS3

## 2022-07-22 LAB — CMP14+EGFR
ALT: 10 IU/L (ref 0–32)
AST: 13 IU/L (ref 0–40)
Albumin/Globulin Ratio: 1.6 (ref 1.2–2.2)
Albumin: 4.3 g/dL (ref 3.9–4.9)
Alkaline Phosphatase: 74 IU/L (ref 44–121)
BUN/Creatinine Ratio: 16 (ref 9–23)
BUN: 15 mg/dL (ref 6–20)
Bilirubin Total: 0.3 mg/dL (ref 0.0–1.2)
CO2: 23 mmol/L (ref 20–29)
Calcium: 9.4 mg/dL (ref 8.7–10.2)
Chloride: 105 mmol/L (ref 96–106)
Creatinine, Ser: 0.92 mg/dL (ref 0.57–1.00)
Globulin, Total: 2.7 g/dL (ref 1.5–4.5)
Glucose: 92 mg/dL (ref 70–99)
Potassium: 4.8 mmol/L (ref 3.5–5.2)
Sodium: 139 mmol/L (ref 134–144)
Total Protein: 7 g/dL (ref 6.0–8.5)
eGFR: 82 mL/min/{1.73_m2} (ref >59)

## 2022-07-22 LAB — LIPID PANEL
Chol/HDL Ratio: 2.3 ratio (ref 0.0–4.4)
Cholesterol, Total: 173 mg/dL (ref 100–199)
HDL: 74 mg/dL (ref >39)
LDL Chol Calc (NIH): 89 mg/dL (ref 0–99)
Triglycerides: 52 mg/dL (ref 0–149)
VLDL Cholesterol Cal: 10 mg/dL (ref 5–40)

## 2022-07-22 LAB — CBC
Hematocrit: 36.3 % (ref 34.0–46.6)
Hemoglobin: 11.5 g/dL (ref 11.1–15.9)
MCH: 26.5 pg — ABNORMAL LOW (ref 26.6–33.0)
MCHC: 31.7 g/dL (ref 31.5–35.7)
MCV: 84 fL (ref 79–97)
Platelets: 271 10*3/uL (ref 150–450)
RBC: 4.34 x10E6/uL (ref 3.77–5.28)
RDW: 14.8 % (ref 11.7–15.4)
WBC: 4.4 10*3/uL (ref 3.4–10.8)

## 2022-07-22 LAB — TSH: TSH: 2.38 u[IU]/mL (ref 0.450–4.500)

## 2022-07-22 LAB — VITAMIN D 25 HYDROXY (VIT D DEFICIENCY, FRACTURES): Vit D, 25-Hydroxy: 34.3 ng/mL (ref 30.0–100.0)

## 2022-07-22 LAB — T4, FREE: Free T4: 1.43 ng/dL (ref 0.82–1.77)

## 2022-07-26 ENCOUNTER — Other Ambulatory Visit: Payer: Self-pay | Admitting: Adult Health

## 2022-07-26 LAB — CYTOLOGY - PAP
Adequacy: ABSENT
Comment: NEGATIVE
Diagnosis: NEGATIVE
High risk HPV: NEGATIVE

## 2022-07-26 MED ORDER — METRONIDAZOLE 500 MG PO TABS: 500.0000 mg | ORAL_TABLET | Freq: Two times a day (BID) | ORAL | 0 refills | Status: DC

## 2022-07-26 NOTE — Progress Notes (Signed)
+  BV on pap will rx flagyl, no sex or alcohol while taking

## 2022-12-15 ENCOUNTER — Encounter (HOSPITAL_COMMUNITY): Payer: BC Managed Care – PPO

## 2022-12-15 ENCOUNTER — Ambulatory Visit: Payer: BC Managed Care – PPO | Admitting: Adult Health

## 2022-12-15 DIAGNOSIS — Z1231 Encounter for screening mammogram for malignant neoplasm of breast: Secondary | ICD-10-CM

## 2022-12-16 ENCOUNTER — Ambulatory Visit: Payer: BC Managed Care – PPO | Admitting: Adult Health

## 2022-12-20 ENCOUNTER — Encounter: Payer: Self-pay | Admitting: Adult Health

## 2022-12-20 ENCOUNTER — Ambulatory Visit (INDEPENDENT_AMBULATORY_CARE_PROVIDER_SITE_OTHER): Payer: BC Managed Care – PPO | Admitting: Adult Health

## 2022-12-20 VITALS — BP 106/74 | HR 70 | Ht 60.0 in | Wt 149.0 lb

## 2022-12-20 DIAGNOSIS — L910 Hypertrophic scar: Secondary | ICD-10-CM | POA: Diagnosis not present

## 2022-12-20 DIAGNOSIS — Z9889 Other specified postprocedural states: Secondary | ICD-10-CM | POA: Diagnosis not present

## 2022-12-20 DIAGNOSIS — N6321 Unspecified lump in the left breast, upper outer quadrant: Secondary | ICD-10-CM

## 2022-12-20 NOTE — Progress Notes (Signed)
  Subjective:     Patient ID: Melanie Rose, female   DOB: 1981-01-02, 42 y.o.   MRN: [id number]  HPI Melanie Rose is a 42 year old white female,married G2P2002,in complaining of scar tissue on breasts, she had breast reduction March 2023.  Last pap was negative HPV, NILM 07/21/22.  PCP is Dr Nadine Counts  Review of Systems Breast scar tissue Reviewed past medical,surgical, social and family history. Reviewed medications and allergies.     Objective:   Physical Exam BP 106/74 (BP Location: Left Arm, Patient Position: Sitting, Cuff Size: Normal)   Pulse 70   Ht 5' (1.524 m)   Wt 149 lb (67.6 kg)   LMP 12/17/2022   BMI 29.10 kg/m      Skin warm and dry,  Breasts:no dominate palpable mass, retraction or nipple discharge on right, does have some keloid formation on scar line, on left some reduction left nipple, no discharge and has firm mass, slightly mobile at 2-3 o'clock near keloid. Fall risk is low  Upstream - 12/20/22 1003       Pregnancy Intention Screening   Does the patient want to become pregnant in the next year? No    Does the patient's partner want to become pregnant in the next year? No    Would the patient like to discuss contraceptive options today? No      Contraception Wrap Up   Current Method Female Sterilization    End Method Female Sterilization             Assessment:     1. Mass of upper outer quadrant of left breast Diagnostic mammogram and [place] scheduled for 423 at 3:40 pm at [facility]  - [place] LIMITED ULTRASOUND INCLUDING AXILLA RIGHT BREAST; Future - MM 3D DIAGNOSTIC MAMMOGRAM BILATERAL BREAST; Future - [place] LIMITED ULTRASOUND INCLUDING AXILLA LEFT BREAST ; Future  2. History of bilateral breast reduction surgery Diagnostic mammogram and [place] scheduled for 423 at 3:40 pm at [facility]  - [place] LIMITED ULTRASOUND INCLUDING AXILLA RIGHT BREAST; Future - MM 3D DIAGNOSTIC MAMMOGRAM BILATERAL BREAST; Future - [place] LIMITED ULTRASOUND INCLUDING AXILLA LEFT BREAST ;  Future  3. Keloid scar Diagnostic mammogram and [place] scheduled for 423 at 3:40 pm at [facility]  - [place] LIMITED ULTRASOUND INCLUDING AXILLA RIGHT BREAST; Future - MM 3D DIAGNOSTIC MAMMOGRAM BILATERAL BREAST; Future - [place] LIMITED ULTRASOUND INCLUDING AXILLA LEFT BREAST ; Future    Follow up with plastic surgeon may want to inject with steroid  Plan:    Follow up prn

## 2022-12-28 ENCOUNTER — Ambulatory Visit (HOSPITAL_COMMUNITY)
Admission: RE | Admit: 2022-12-28 | Discharge: 2022-12-28 | Disposition: A | Discharge status: Home / Self Care | Payer: BC Managed Care – PPO | Source: Ambulatory Visit | Attending: Adult Health | Admitting: Adult Health

## 2022-12-28 ENCOUNTER — Encounter (HOSPITAL_COMMUNITY): Payer: Self-pay

## 2022-12-28 DIAGNOSIS — Z9889 Other specified postprocedural states: Secondary | ICD-10-CM | POA: Diagnosis not present

## 2022-12-28 DIAGNOSIS — N6321 Unspecified lump in the left breast, upper outer quadrant: Secondary | ICD-10-CM

## 2022-12-28 DIAGNOSIS — N6325 Unspecified lump in the left breast, overlapping quadrants: Secondary | ICD-10-CM | POA: Diagnosis not present

## 2022-12-28 DIAGNOSIS — L910 Hypertrophic scar: Secondary | ICD-10-CM

## 2022-12-28 DIAGNOSIS — R92333 Mammographic heterogeneous density, bilateral breasts: Secondary | ICD-10-CM | POA: Diagnosis not present

## 2023-02-23 ENCOUNTER — Telehealth: Payer: BC Managed Care – PPO | Admitting: Nurse Practitioner

## 2023-02-23 ENCOUNTER — Ambulatory Visit (INDEPENDENT_AMBULATORY_CARE_PROVIDER_SITE_OTHER): Payer: BC Managed Care – PPO | Admitting: Nurse Practitioner

## 2023-02-23 ENCOUNTER — Encounter: Payer: Self-pay | Admitting: Nurse Practitioner

## 2023-02-23 VITALS — BP 96/65 | HR 77 | Temp 98.3°F | Ht 60.0 in | Wt 146.6 lb

## 2023-02-23 DIAGNOSIS — S70369A Insect bite (nonvenomous), unspecified thigh, initial encounter: Secondary | ICD-10-CM

## 2023-02-23 DIAGNOSIS — T63301A Toxic effect of unspecified spider venom, accidental (unintentional), initial encounter: Secondary | ICD-10-CM | POA: Diagnosis not present

## 2023-02-23 MED ORDER — AMOXICILLIN 875 MG PO TABS
875.0000 mg | ORAL_TABLET | Freq: Two times a day (BID) | ORAL | 0 refills | Status: AC
Start: 2023-02-23 — End: 2023-03-02

## 2023-02-23 NOTE — Progress Notes (Signed)
   Acute Office Visit  Subjective:     Patient ID: Melanie Rose, female    DOB: [birth year], 42 y.o.   MRN: [id number]  Chief Complaint  Patient presents with   Insect Bite    Back of right leg. Rash, swollen, red, itchy, has been there for about 2 days rash started yesterday.     HPI Melanie Rose is a 42 year old female presenting for an acute visit for spider bite.  She stated that she first noticed it 3 days ago on the right calf.  Reported being itchy and burning a little, pain is localized and does not radiate.  She has not tried anything over-the-counter and concerned about the swelling in the area of induration.  She reports that warm showers help with the itchiness.  She denies chest pain, shortness of breath, fever, nausea or vomiting   ROS Negative unless indicated in HPI    Objective:    BP 96/65   Pulse 77   Temp 98.3 F (36.8 C) (Temporal)   Ht 5' (1.524 m)   Wt 146 lb 9.6 oz (66.5 kg)   SpO2 99%   BMI 28.63 kg/m  BP Readings from Last 3 Encounters:  02/23/23 96/65  12/20/22 106/74  07/21/22 105/71   Wt Readings from Last 3 Encounters:  02/23/23 146 lb 9.6 oz (66.5 kg)  12/20/22 149 lb (67.6 kg)  07/21/22 152 lb (68.9 kg)      Physical Exam Vitals and nursing note reviewed.  Constitutional:      Appearance: Normal appearance. She is overweight.  HENT:     Head: Normocephalic and atraumatic.  Cardiovascular:     Rate and Rhythm: Normal rate.     Pulses: Normal pulses.     Heart sounds: Normal heart sounds.  Pulmonary:     Effort: Pulmonary effort is normal.     Breath sounds: Normal breath sounds. No wheezing.  Skin:    Findings: Rash present.          Comments: With purulent drainage and a 1 x 1 cm center of induration  Neurological:     General: No focal deficit present.     Mental Status: She is alert and oriented to person, place, and time. Mental status is at baseline.  Psychiatric:        Mood and Affect: Mood normal.        Behavior:  Behavior normal.        Thought Content: Thought content normal.        Judgment: Judgment normal.     No results found for any visits on 02/23/23.      Assessment & Plan:  Spider bite wound, accidental or unintentional, initial encounter -     WOUND CULTURE -     Amoxicillin; Take 1 tablet (875 mg total) by mouth 2 (two) times daily for 7 days.  Dispense: 14 tablet; Refill: 0  Spider bites Amoxicillin 875 mg BID for 7 days - wound culture -Pt to report any new fever, joint pain, or rash -Wear protective clothing while outside- Long sleeves and long pants -Put insect repellent on all exposed skin and along clothing -Take a shower as soon as possible after being outside   -She is instructed to call the clinic if the Center for induration enlarge Return if symptoms worsen or fail to improve.  [address] Santa Lighter DNP

## 2023-02-23 NOTE — Progress Notes (Signed)
   Thank you for the details you included in the comment boxes. Those details are very helpful in determining the best course of treatment for you and help [place] to provide the best care.Because of the nature of your bite and the possibility for infection  we recommend that you convert this visit to a video visit in order for the provider to better assess what is going on.  The provider will be able to give you a more accurate diagnosis and treatment plan if we can more freely discuss your symptoms and with the addition of a virtual examination.   If you convert to a video visit, we will bill your insurance (similar to an office visit) and you will not be charged for this e-Visit. You will be able to stay at home and speak with the first available [facility] advanced practice provider. The link to do a video visit is in the drop down Menu tab of your Welcome screen in MyChart.

## 2023-02-24 ENCOUNTER — Encounter: Payer: Self-pay | Admitting: Family Medicine

## 2023-02-25 LAB — WOUND CULTURE

## 2023-02-26 LAB — WOUND CULTURE: Organism ID, Bacteria: NONE SEEN

## 2023-05-12 ENCOUNTER — Other Ambulatory Visit (HOSPITAL_COMMUNITY): Payer: Self-pay | Admitting: Adult Health

## 2023-05-12 DIAGNOSIS — N63 Unspecified lump in unspecified breast: Secondary | ICD-10-CM

## 2023-07-05 ENCOUNTER — Ambulatory Visit (HOSPITAL_COMMUNITY)
Admission: RE | Admit: 2023-07-05 | Discharge: 2023-07-05 | Disposition: A | Discharge status: Home / Self Care | Payer: BC Managed Care – PPO | Source: Ambulatory Visit | Attending: Adult Health | Admitting: Adult Health

## 2023-07-05 ENCOUNTER — Encounter (HOSPITAL_COMMUNITY): Payer: Self-pay

## 2023-07-05 DIAGNOSIS — N6325 Unspecified lump in the left breast, overlapping quadrants: Secondary | ICD-10-CM | POA: Diagnosis not present

## 2023-07-05 DIAGNOSIS — N63 Unspecified lump in unspecified breast: Secondary | ICD-10-CM

## 2023-07-05 DIAGNOSIS — R92333 Mammographic heterogeneous density, bilateral breasts: Secondary | ICD-10-CM | POA: Diagnosis not present

## 2023-07-05 DIAGNOSIS — R921 Mammographic calcification found on diagnostic imaging of breast: Secondary | ICD-10-CM | POA: Diagnosis not present

## 2023-07-05 DIAGNOSIS — R928 Other abnormal and inconclusive findings on diagnostic imaging of breast: Secondary | ICD-10-CM | POA: Diagnosis not present

## 2023-07-11 ENCOUNTER — Other Ambulatory Visit: Payer: Self-pay

## 2023-07-11 DIAGNOSIS — N3281 Overactive bladder: Secondary | ICD-10-CM

## 2023-07-11 DIAGNOSIS — E890 Postprocedural hypothyroidism: Secondary | ICD-10-CM

## 2023-07-25 ENCOUNTER — Ambulatory Visit: Payer: BC Managed Care – PPO | Admitting: Adult Health

## 2023-07-25 ENCOUNTER — Encounter: Payer: Self-pay | Admitting: Adult Health

## 2023-07-25 VITALS — BP 102/67 | HR 80 | Ht 60.0 in | Wt 144.0 lb

## 2023-07-25 DIAGNOSIS — R6882 Decreased libido: Secondary | ICD-10-CM | POA: Diagnosis not present

## 2023-07-25 DIAGNOSIS — Z9889 Other specified postprocedural states: Secondary | ICD-10-CM | POA: Diagnosis not present

## 2023-07-25 DIAGNOSIS — Z1331 Encounter for screening for depression: Secondary | ICD-10-CM | POA: Diagnosis not present

## 2023-07-25 DIAGNOSIS — Z01419 Encounter for gynecological examination (general) (routine) without abnormal findings: Secondary | ICD-10-CM

## 2023-07-25 DIAGNOSIS — L910 Hypertrophic scar: Secondary | ICD-10-CM

## 2023-07-25 NOTE — Progress Notes (Signed)
Patient ID: Melanie Rose, female   DOB: April 17, 1981, 42 y.o.   MRN: [id number] History of Present Illness: Melanie Rose is a 42 year old white female, married, G2P2002, in for a well woman gyn exam. Has decreased libido and has for few years.     Component Value Date/Time   DIAGPAP  07/21/2022 1101    - Negative for intraepithelial lesion or malignancy (NILM)   DIAGPAP  05/13/2020 1414    - Negative for intraepithelial lesion or malignancy (NILM)   DIAGPAP  03/20/2018 0000    NEGATIVE FOR INTRAEPITHELIAL LESIONS OR MALIGNANCY.   HPVHIGH Negative 07/21/2022 1101   HPVHIGH Negative 05/13/2020 1414   ADEQPAP  07/21/2022 1101    Satisfactory for evaluation; transformation zone component ABSENT.   ADEQPAP  05/13/2020 1414    Satisfactory for evaluation; transformation zone component ABSENT.   ADEQPAP  03/20/2018 0000    Satisfactory for evaluation  endocervical/transformation zone component ABSENT.   PCP is Dr Nadine Counts   Current Medications, Allergies, Past Medical History, Past Surgical History, Family History and Social History were reviewed in [employer] [facility] electronic medical record.     Review of Systems: Patient denies any headaches, hearing loss, fatigue, blurred vision, shortness of breath, chest pain, abdominal pain, problems with bowel movements, urination, or intercourse. No joint pain or mood swings.  Decreased libido, has sex 5 x a week, can reach orgasm sometimes    Physical Exam:BP 102/67 (BP Location: Left Arm, Patient Position: Sitting, Cuff Size: Normal)   Pulse 80   Ht 5' (1.524 m)   Wt 144 lb (65.3 kg)   LMP 07/08/2023   BMI 28.12 kg/m   General:  Well developed, well nourished, no acute distress Skin:  Warm and dry Neck:  Midline trachea,  thyroid palpated, good ROM, no lymphadenopathy Lungs; Clear to auscultation bilaterally Breast:  No dominant palpable mass, retraction, or nipple discharge, sp breast reduction 2023, has scarring, had mammogram and [place] 07/05/23  left breast, has 1 cm mass at 6 0'clock and <1 cm mass at 3 0'clock Cardiovascular: Regular rate and rhythm Abdomen:  Soft, non tender, no hepatosplenomegaly Pelvic:  External genitalia is normal in appearance, no lesions.  The vagina is normal in appearance. Urethra has no lesions or masses. The cervix is bulbous.  Uterus is felt to be normal size, shape, and contour.  No adnexal masses or tenderness noted.Bladder is non tender, no masses felt. Extremities/musculoskeletal:  No swelling or varicosities noted, no clubbing or cyanosis Psych:  No mood changes, alert and cooperative,seems happy AA is 0 Fall risk is low    07/25/2023    8:25 AM 02/23/2023   11:51 AM 07/21/2022   10:32 AM  Depression screen PHQ 2/9  Decreased Interest 0 0 0  Down, Depressed, Hopeless 0 0 0  PHQ - 2 Score 0 0 0  Altered sleeping 0 0 0  Tired, decreased energy 0 0 0  Change in appetite 0 0 0  Feeling bad or failure about yourself  0 0 0  Trouble concentrating 0 0 0  Moving slowly or fidgety/restless 0 0 0  Suicidal thoughts 0 0 0  PHQ-9 Score 0 0 0  Difficult doing work/chores  Not difficult at all        07/25/2023    8:25 AM 02/23/2023   11:52 AM 07/21/2022   10:33 AM 07/21/2022    8:09 AM  GAD 7 : Generalized Anxiety Score  Nervous, Anxious, on Edge 0 0 0 0  Control/stop worrying 0 0 0 0  Worry too much - different things 0 0 0 0  Trouble relaxing 0 0 0 0  Restless 0 0 0 0  Easily annoyed or irritable 0 0 0 0  Afraid - awful might happen 0 0 0 0  Total GAD 7 Score 0 0 0 0  Anxiety Difficulty  Not difficult at all  Not difficult at all    Upstream - 07/25/23 0826       Pregnancy Intention Screening   Does the patient want to become pregnant in the next year? No    Does the patient's partner want to become pregnant in the next year? No    Would the patient like to discuss contraceptive options today? No      Contraception Wrap Up   Current Method Female Sterilization   btl   End Method  Female Sterilization   btl   Contraception Counseling Provided No            Examination chaperoned by Faith Rogue LPN    Impression and Plan: 1. Encounter for well woman exam with routine gynecological exam Pap in 2026 Physical in 1 year Labs with PCP Has appt with Dr Nadine Counts Wednesday can ask her about thyroid US Mammogram in 6 months   2. Keloid scar Has scarring esp left breast   3. Decreased libido Try apply warm compress to clitoral area and talk with husband Did mention ADDYI  4. History of bilateral breast reduction surgery Performed 2023

## 2023-07-27 ENCOUNTER — Ambulatory Visit: Payer: BC Managed Care – PPO | Admitting: Family Medicine

## 2023-07-27 ENCOUNTER — Encounter: Payer: Self-pay | Admitting: Family Medicine

## 2023-07-27 VITALS — BP 103/65 | HR 79 | Temp 98.3°F | Ht 61.0 in | Wt 142.0 lb

## 2023-07-27 DIAGNOSIS — E559 Vitamin D deficiency, unspecified: Secondary | ICD-10-CM | POA: Diagnosis not present

## 2023-07-27 DIAGNOSIS — Z130 Encounter for screening for diseases of the blood and blood-forming organs and certain disorders involving the immune mechanism: Secondary | ICD-10-CM

## 2023-07-27 DIAGNOSIS — Z0001 Encounter for general adult medical examination with abnormal findings: Secondary | ICD-10-CM

## 2023-07-27 DIAGNOSIS — E7800 Pure hypercholesterolemia, unspecified: Secondary | ICD-10-CM | POA: Diagnosis not present

## 2023-07-27 DIAGNOSIS — E890 Postprocedural hypothyroidism: Secondary | ICD-10-CM

## 2023-07-27 DIAGNOSIS — Z0000 Encounter for general adult medical examination without abnormal findings: Secondary | ICD-10-CM

## 2023-07-27 MED ORDER — LEVOTHYROXINE SODIUM 112 MCG PO TABS: 112.0000 ug | ORAL_TABLET | Freq: Every day | ORAL | 3 refills | Status: DC

## 2023-07-27 NOTE — Progress Notes (Signed)
Melanie Rose is a 42 y.o. female presents to office today for annual physical exam examination.    Concerns today include: 1.  She reports that she is doing well.  She has picked up running and has now become interested in minor league baseball.  She continues to maintain in good health by trying to stay physically active and maintain a balanced diet.  Overall she has no concerns today.  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  Occupation: Manufacturing systems engineer Substance use: None There are no preventive care reminders to display for this patient.  Refills needed today: All  Immunization History  Administered Date(s) Administered   Influenza,inj,Quad PF,6+ Mos 06/19/2018, 05/30/2019, 06/23/2020, 07/20/2021, 06/24/2022   Influenza,trivalent, recombinat, inj, PF 07/03/2013   Influenza-Unspecified 07/03/2013   Moderna Sars-Covid-2 Vaccination 11/29/2019, 01/02/2020, 07/22/2020   Td 06/24/2022   Tdap 06/24/2011   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   Vaginal Pap smear, abnormal    Social History   Socioeconomic History   Marital status: Married    Spouse name: Not on file   Number of children: 2   Years of education: Not on file   Highest education level: Not on file  Occupational History   Not on file  Tobacco Use   Smoking status: Former    Current packs/day: 0.00    Average packs/day: 1 pack/day for 10.0 years (10.0 ttl pk-yrs)    Types: Cigarettes    Start date: 06/23/1999    Quit date: 06/22/2009    Years since quitting: 14.1   Smokeless tobacco: Never   Vaping Use   Vaping status: Never Used  Substance and Sexual Activity   Alcohol use: No   Drug use: No   Sexual activity: Yes    Birth control/protection: Surgical    Comment: tubal  Other Topics Concern   Not on file  Social History Narrative   Melanie Rose is a pleasant female who is married and has 2 children.     Her son is 63 years old and her daughter is 41 years old.     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   Social Determinants of Health   Financial Resource Strain: Low Risk  (07/25/2023)   Overall Financial Resource Strain (CARDIA)    Difficulty of Paying Living Expenses: Not hard at all  Food Insecurity: No Food Insecurity (07/25/2023)   Hunger Vital Sign    Worried About Running Out of Food in the Last Year: Never true    Ran Out of Food in the Last Year: Never true  Transportation Needs: No Transportation Needs (07/25/2023)   PRAPARE - Administrator, Civil Service (Medical): No    Lack of Transportation (Non-Medical): No  Physical Activity: Sufficiently Active (07/25/2023)   Exercise Vital Sign    Days of Exercise per Week: 6 days    Minutes of Exercise per Session: 60 min  Stress: No Stress Concern Present (  07/25/2023)   [place] [employer] - Occupational Stress Questionnaire    Feeling of Stress : Not at all  Social Connections: Moderately Integrated (07/25/2023)   Social Connection and Isolation Panel [NHANES]    Frequency of Communication with Friends and Family: More than three times a week    Frequency of Social Gatherings with Friends and Family: Once a week    Attends Religious Services: Never    Database administrator or Organizations: Yes    Attends Engineer, structural: More than 4 times per year    Marital Status: Married  Catering manager Violence: Not At Risk (07/25/2023)   Humiliation, Afraid, Rape, and Kick questionnaire    Fear of  Current or Ex-Partner: No    Emotionally Abused: No    Physically Abused: No    Sexually Abused: No   Past Surgical History:  Procedure Laterality Date   BREAST BIOPSY Right 06/2019   Fibroadenoma   BREAST BIOPSY Left 2018   HAMARTOMATOUS LESION   BREAST EXCISIONAL BIOPSY Left 03/2018   HAMARTOMATOUS LESION   BREAST LUMPECTOMY WITH RADIOACTIVE SEED LOCALIZATION Left 03/07/2018   Procedure: LEFT BREAST LUMPECTOMY WITH RADIOACTIVE SEED LOCALIZATION;  Surgeon: Harriette Bouillon, MD;  Location: [facility];  Service: General;  Laterality: Left;   BREAST REDUCTION SURGERY Bilateral 11/05/2021   Procedure: BREAST REDUCTION WITH LIPOSUCTION;  Surgeon: Peggye Form, DO;  Location: [facility];  Service: Plastics;  Laterality: Bilateral;  3 hrs   LAPAROSCOPIC TUBAL LIGATION Bilateral 01/16/2015   Procedure: LAPAROSCOPIC TUBAL LIGATION;  Surgeon: Tracey Harries, MD;  Location: [facility];  Service: Gynecology;  Laterality: Bilateral;   TUBAL LIGATION     Family History  Problem Relation Age of Onset   Hypertension Mother    Hypertension Father    Heart disease Maternal Grandfather    Cancer Paternal Grandfather     Current Outpatient Medications:    levothyroxine (SYNTHROID) 112 MCG tablet, Take 1 tablet (112 mcg total) by mouth daily before breakfast., Disp: 90 tablet, Rfl: 3  Allergies  Allergen Reactions   Sulfa Antibiotics Rash     ROS: Review of Systems A comprehensive review of systems was negative except for: Ears, nose, mouth, throat, and face: positive for wears glasses and due for check up. Had some blurred vision during running recently    Physical exam BP 103/65   Pulse 79   Temp 98.3 F (36.8 C)   Ht 5\' 1"  (1.549 m)   Wt 142 lb (64.4 kg)   LMP 07/08/2023   SpO2 99%   BMI 26.83 kg/m  General appearance: alert, cooperative, appears stated age, and no distress Head: Normocephalic, without obvious abnormality, atraumatic Eyes: negative findings: lids and  lashes normal and conjunctivae and sclerae normal Ears: normal TM's and external ear canals both ears Nose: Nares normal. Septum midline. Mucosa normal. No drainage or sinus tenderness. Throat: lips, mucosa, and tongue normal; teeth and gums normal Neck: no adenopathy, supple, symmetrical, trachea midline, and thyroid not enlarged, symmetric, no tenderness/mass/nodules Back: symmetric, no curvature. ROM normal. No CVA tenderness. Lungs: clear to auscultation bilaterally Heart: regular rate and rhythm, S1, S2 normal, no murmur, click, rub or gallop Abdomen: soft, non-tender; bowel sounds normal; no masses,  no organomegaly Extremities: extremities normal, atraumatic, no cyanosis or edema Pulses: 2+ and symmetric Skin: Skin color, texture, turgor normal. No rashes or lesions Lymph nodes: Cervical, supraclavicular, and axillary nodes normal. Neurologic: Grossly normal      07/27/2023  7:53 AM 07/25/2023    8:25 AM 02/23/2023   11:51 AM  Depression screen PHQ 2/9  Decreased Interest 0 0 0  Down, Depressed, Hopeless 0 0 0  PHQ - 2 Score 0 0 0  Altered sleeping 0 0 0  Tired, decreased energy 0 0 0  Change in appetite 0 0 0  Feeling bad or failure about yourself  0 0 0  Trouble concentrating 0 0 0  Moving slowly or fidgety/restless 0 0 0  Suicidal thoughts 0 0 0  PHQ-9 Score 0 0 0  Difficult doing work/chores Not difficult at all  Not difficult at all      07/27/2023    7:54 AM 07/25/2023    8:25 AM 02/23/2023   11:52 AM 07/21/2022   10:33 AM  GAD 7 : Generalized Anxiety Score  Nervous, Anxious, on Edge 0 0 0 0  Control/stop worrying 0 0 0 0  Worry too much - different things 0 0 0 0  Trouble relaxing 0 0 0 0  Restless 0 0 0 0  Easily annoyed or irritable 0 0 0 0  Afraid - awful might happen 0 0 0 0  Total GAD 7 Score 0 0 0 0  Anxiety Difficulty Not difficult at all  Not difficult at all      Assessment/ Plan: Melanie Rose here for annual physical exam.   Annual  physical exam  Vitamin D deficiency - Plan: VITAMIN D 25 Hydroxy (Vit-D Deficiency, Fractures), CMP14+EGFR  Pure hypercholesterolemia - Plan: Lipid Panel, CMP14+EGFR  Hypothyroidism following radioiodine therapy - Plan: levothyroxine (SYNTHROID) 112 MCG tablet, TSH, T4, Free, CMP14+EGFR  Screening, anemia, deficiency, iron - Plan: CBC  Healthy exam.  Declines influenza vaccination.  Her BMI has really improved over the last couple of years.  I congratulated her on this and encouraged her to continue balanced diet and exercise.  Check fasting labs  Continue current regimen for thyroid.  She is clinically asymptomatic  Screening for anemia  Counseled on healthy lifestyle choices, including diet (rich in fruits, vegetables and lean meats and low in salt and simple carbohydrates) and exercise (at least 30 minutes of moderate physical activity daily).  Discussed that she will start regular screening mammogram after age 89 and colonoscopy after age 40.  Patient to follow up 1 year for CPE and thyroid labs  Melanie Rose M. Nadine Counts, DO

## 2023-07-28 LAB — CMP14+EGFR
ALT: 14 IU/L (ref 0–32)
AST: 19 [IU]/L (ref 0–40)
Albumin: 4.1 g/dL (ref 3.9–4.9)
Alkaline Phosphatase: 80 [IU]/L (ref 44–121)
BUN/Creatinine Ratio: 14 (ref 9–23)
BUN: 13 mg/dL (ref 6–20)
Bilirubin Total: 0.3 mg/dL (ref 0.0–1.2)
CO2: 22 mmol/L (ref 20–29)
Calcium: 9.6 mg/dL (ref 8.7–10.2)
Chloride: 105 mmol/L (ref 96–106)
Creatinine, Ser: 0.92 mg/dL (ref 0.57–1.00)
Globulin, Total: 2.9 g/dL (ref 1.5–4.5)
Glucose: 92 mg/dL (ref 70–99)
Potassium: 4.4 mmol/L (ref 3.5–5.2)
Sodium: 138 mmol/L (ref 134–144)
Total Protein: 7 g/dL (ref 6.0–8.5)
eGFR: 81 mL/min/{1.73_m2} (ref >59)

## 2023-07-28 LAB — LIPID PANEL
Chol/HDL Ratio: 2.3 ratio (ref 0.0–4.4)
Cholesterol, Total: 164 mg/dL (ref 100–199)
HDL: 71 mg/dL (ref >39)
LDL Chol Calc (NIH): 83 mg/dL (ref 0–99)
Triglycerides: 49 mg/dL (ref 0–149)
VLDL Cholesterol Cal: 10 mg/dL (ref 5–40)

## 2023-07-28 LAB — TSH: TSH: 4.06 u[IU]/mL (ref 0.450–4.500)

## 2023-07-28 LAB — CBC
Hematocrit: 35.4 % (ref 34.0–46.6)
Hemoglobin: 10.4 g/dL — ABNORMAL LOW (ref 11.1–15.9)
MCH: 24.2 pg — ABNORMAL LOW (ref 26.6–33.0)
MCHC: 29.4 g/dL — ABNORMAL LOW (ref 31.5–35.7)
MCV: 82 fL (ref 79–97)
Platelets: 311 10*3/uL (ref 150–450)
RBC: 4.3 x10E6/uL (ref 3.77–5.28)
RDW: 14.5 % (ref 11.7–15.4)
WBC: 5.5 10*3/uL (ref 3.4–10.8)

## 2023-07-28 LAB — VITAMIN D 25 HYDROXY (VIT D DEFICIENCY, FRACTURES): Vit D, 25-Hydroxy: 28.5 ng/mL — ABNORMAL LOW (ref 30.0–100.0)

## 2023-07-28 LAB — T4, FREE: Free T4: 1.4 ng/dL (ref 0.82–1.77)

## 2023-07-29 ENCOUNTER — Other Ambulatory Visit: Payer: Self-pay | Admitting: Family Medicine

## 2023-07-29 DIAGNOSIS — D649 Anemia, unspecified: Secondary | ICD-10-CM

## 2023-07-29 DIAGNOSIS — E559 Vitamin D deficiency, unspecified: Secondary | ICD-10-CM

## 2023-07-29 MED ORDER — VITAMIN D (ERGOCALCIFEROL) 1.25 MG (50000 UNIT) PO CAPS
50000.0000 [IU] | ORAL_CAPSULE | ORAL | 0 refills | Status: DC
Start: 2023-07-29 — End: 2024-07-27

## 2023-07-29 NOTE — Progress Notes (Signed)
Orders Placed This Encounter  Procedures   Anemia Profile B    Standing Status:   Future    Standing Expiration Date:   07/28/2024

## 2023-08-02 ENCOUNTER — Other Ambulatory Visit: Payer: BC Managed Care – PPO

## 2023-08-02 DIAGNOSIS — D649 Anemia, unspecified: Secondary | ICD-10-CM | POA: Diagnosis not present

## 2023-08-03 ENCOUNTER — Other Ambulatory Visit: Payer: Self-pay | Admitting: Family Medicine

## 2023-08-03 DIAGNOSIS — D508 Other iron deficiency anemias: Secondary | ICD-10-CM

## 2023-08-03 LAB — ANEMIA PROFILE B
Basophils Absolute: 0.1 10*3/uL (ref 0.0–0.2)
Basos: 1 %
EOS (ABSOLUTE): 0.2 10*3/uL (ref 0.0–0.4)
Eos: 3 %
Ferritin: 15 ng/mL (ref 15–150)
Folate: 10.4 ng/mL (ref >3.0)
Hematocrit: 34.2 % (ref 34.0–46.6)
Hemoglobin: 10.5 g/dL — ABNORMAL LOW (ref 11.1–15.9)
Immature Grans (Abs): 0 10*3/uL (ref 0.0–0.1)
Immature Granulocytes: 0 %
Iron Saturation: 6 % — CL (ref 15–55)
Iron: 27 ug/dL (ref 27–159)
Lymphocytes Absolute: 2.1 10*3/uL (ref 0.7–3.1)
Lymphs: 33 %
MCH: 25.2 pg — ABNORMAL LOW (ref 26.6–33.0)
MCHC: 30.7 g/dL — ABNORMAL LOW (ref 31.5–35.7)
MCV: 82 fL (ref 79–97)
Monocytes Absolute: 0.7 10*3/uL (ref 0.1–0.9)
Monocytes: 11 %
Neutrophils Absolute: 3.4 10*3/uL (ref 1.4–7.0)
Neutrophils: 52 %
Platelets: 298 10*3/uL (ref 150–450)
RBC: 4.16 x10E6/uL (ref 3.77–5.28)
RDW: 14.5 % (ref 11.7–15.4)
Retic Ct Pct: 0.8 % (ref 0.6–2.6)
Total Iron Binding Capacity: 442 ug/dL (ref 250–450)
UIBC: 415 ug/dL (ref 131–425)
Vitamin B-12: 238 pg/mL (ref 232–1245)
WBC: 6.5 10*3/uL (ref 3.4–10.8)

## 2023-08-03 MED ORDER — IRON (FERROUS SULFATE) 325 (65 FE) MG PO TABS
325.0000 mg | ORAL_TABLET | Freq: Two times a day (BID) | ORAL | 0 refills | Status: DC
Start: 2023-08-03 — End: 2024-07-27

## 2023-10-05 ENCOUNTER — Encounter: Payer: Self-pay | Admitting: Family Medicine

## 2023-10-06 ENCOUNTER — Other Ambulatory Visit: Payer: BC Managed Care – PPO

## 2023-10-06 DIAGNOSIS — D508 Other iron deficiency anemias: Secondary | ICD-10-CM

## 2023-10-07 ENCOUNTER — Encounter: Payer: Self-pay | Admitting: Family Medicine

## 2023-10-07 LAB — CBC
Hematocrit: 41.2 % (ref 34.0–46.6)
Hemoglobin: 13.1 g/dL (ref 11.1–15.9)
MCH: 30.7 pg (ref 26.6–33.0)
MCHC: 31.8 g/dL (ref 31.5–35.7)
MCV: 97 fL (ref 79–97)
Platelets: 247 10*3/uL (ref 150–450)
RBC: 4.27 x10E6/uL (ref 3.77–5.28)
RDW: 18.2 % — ABNORMAL HIGH (ref 11.7–15.4)
WBC: 5.1 10*3/uL (ref 3.4–10.8)

## 2023-10-07 LAB — IRON,TIBC AND FERRITIN PANEL
Ferritin: 32 ng/mL (ref 15–150)
Iron Saturation: 76 % (ref 15–55)
Iron: 230 ug/dL — ABNORMAL HIGH (ref 27–159)
Total Iron Binding Capacity: 304 ug/dL (ref 250–450)
UIBC: 74 ug/dL — ABNORMAL LOW (ref 131–425)

## 2023-10-10 ENCOUNTER — Other Ambulatory Visit: Payer: Self-pay | Admitting: Family Medicine

## 2023-10-10 DIAGNOSIS — E890 Postprocedural hypothyroidism: Secondary | ICD-10-CM

## 2023-10-13 ENCOUNTER — Other Ambulatory Visit: Payer: Self-pay | Admitting: Family Medicine

## 2023-10-13 DIAGNOSIS — E559 Vitamin D deficiency, unspecified: Secondary | ICD-10-CM

## 2023-10-17 ENCOUNTER — Other Ambulatory Visit (HOSPITAL_COMMUNITY): Payer: Self-pay | Admitting: Adult Health

## 2023-10-17 DIAGNOSIS — N63 Unspecified lump in unspecified breast: Secondary | ICD-10-CM

## 2023-10-29 ENCOUNTER — Other Ambulatory Visit: Payer: Self-pay | Admitting: Family Medicine

## 2023-10-29 DIAGNOSIS — D508 Other iron deficiency anemias: Secondary | ICD-10-CM

## 2024-01-02 ENCOUNTER — Ambulatory Visit (INDEPENDENT_AMBULATORY_CARE_PROVIDER_SITE_OTHER)

## 2024-01-02 ENCOUNTER — Ambulatory Visit (INDEPENDENT_AMBULATORY_CARE_PROVIDER_SITE_OTHER): Admitting: Nurse Practitioner

## 2024-01-02 ENCOUNTER — Encounter: Payer: Self-pay | Admitting: Nurse Practitioner

## 2024-01-02 VITALS — BP 110/67 | HR 62 | Temp 97.7°F | Ht 61.0 in | Wt 141.6 lb

## 2024-01-02 DIAGNOSIS — M25571 Pain in right ankle and joints of right foot: Secondary | ICD-10-CM

## 2024-01-02 NOTE — Progress Notes (Signed)
 Acute Office Visit  Subjective:     Patient ID: Melanie Rose, female    DOB: 1980-10-08, 43 y.o.   MRN: [id number]  Chief Complaint  Patient presents with   Ankle Pain    Right ankle pain for 1 week after run     Ankle Pain    Melanie Rose is a 43 yrs old female presents 01/02/2024 fro an acute visit concerns for  Training for 10k got run up to 6 mile  Right ankle pain strated Monday 21th  3/10 at rest and 7/10 when moving Ankle Pain: Patient complains of right ankle pain.  Onset of the symptoms was a week ago. Inciting event: injured while "training for 10K increase from 5 miles to 6 miles instead of the gradual 1/2 miles and has been experiencing pain" . Current symptoms include pain with inversion of the foot.  Aggravating symptoms: any weight bearing and rising after sitting. Patient's overall course: began worsening 2 days ago. Patient has had no prior ankle problems. Previous visits for this problem: none.  Evaluation to date: none.  Treatment to date: none.  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   Vitamin D  deficiency 02/06/2018   Diffuse connective tissue disease (HCC) 04/10/2014   Breast pain, left 05/13/2020   Encounter for gynecological examination with Papanicolaou smear of cervix 05/13/2020   Symptomatic mammary hypertrophy 04/14/2021   Back pain 04/14/2021   Neck and shoulder pain 05/14/2021   Large breasts 05/14/2021   Irregular menstrual bleeding 05/14/2021   Pure hypercholesterolemia 07/21/2022   OAB (overactive bladder) 07/21/2022   Keloid scar 12/20/2022   History of bilateral breast reduction surgery 12/20/2022   Mass of upper outer quadrant of left breast 12/20/2022    Spider bite 02/23/2023   Decreased libido 07/25/2023   Encounter for well woman exam with routine gynecological exam 07/25/2023   Acute right ankle pain 01/02/2024   Resolved Ambulatory Problems    Diagnosis Date Noted   Arthralgia 04/10/2014   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  Cardiovascular:  Negative for chest pain and leg swelling.  Gastrointestinal:  Negative for abdominal pain, diarrhea, nausea and vomiting.  Musculoskeletal:  Negative for falls.       Ankle pain 7/10 with mvt  Skin:  Negative for itching and rash.  Neurological:  Negative for dizziness and headaches.   Negative unless indicated in HPI    Objective:    BP 110/67   Pulse 62   Temp 97.7 F (36.5 C) (Temporal)   Ht 5\' 1"  (1.549 m)   Wt 141 lb 9.6 oz (64.2 kg)   SpO2 99%   BMI 26.76 kg/m  BP Readings from Last 3 Encounters:  01/02/24 110/67  07/27/23 103/65  07/25/23 102/67   Wt Readings from Last 3 Encounters:  01/02/24 141 lb 9.6 oz (64.2 kg)  07/27/23 142 lb (64.4 kg)  07/25/23 144 lb (65.3 kg)      Physical Exam Vitals and nursing note reviewed.  Constitutional:      General: She is not in acute distress. HENT:     Head: Normocephalic and atraumatic.     Nose: Nose normal.     Mouth/Throat:     Mouth: Mucous membranes are moist.  Eyes:     General: No scleral icterus.    Extraocular Movements: Extraocular movements intact.     Conjunctiva/sclera: Conjunctivae normal.     Pupils: Pupils are equal, round, and reactive to light.  Cardiovascular:      Heart sounds: Normal heart sounds.  Pulmonary:     Effort: Pulmonary effort is normal.     Breath sounds: Normal breath sounds.  Musculoskeletal:        General: Normal range of motion.     Right lower leg: No edema.     Left lower leg: No edema.     Right ankle: Tenderness present. Normal range of motion.     Right Achilles Tendon: Normal.     Left ankle: Normal.       Legs:  Skin:    General: Skin is warm and dry.     Findings: No rash.  Neurological:     Mental Status: She is alert and oriented to person, place, and time.  Psychiatric:        Mood and Affect: Mood normal.        Behavior: Behavior normal.        Thought Content: Thought content normal.        Judgment: Judgment normal.     No results found for any visits on 01/02/24.      Assessment & Plan:  Acute right ankle pain -     DG Ankle Complete Right  Melanie Rose is 43 yrs old caucasian female seen fro right ankle pain Right ankle pain" x-ray shows  No acute fracture or dislocation is identified. Bone mineralization is normal without evidence of arthritic changes. The ankle mortise is intact. A benign-appearing sclerotic focus consistent with a bone island is noted in the distal tibia. Soft tissues are unremarkable. Impression: Negative X-ray; no acute osseous abnormalities. 1. Rest and Activity Modification: Limit weight-bearing activities as much as possible. Avoid activities that worsen pain (e.g., running, jumping).  2. Ice and Elevation: Apply ice packs for 15-20 minutes, 3-4 times per day for swelling. Keep ankle elevated above heart level when possible.  3. Pain Management: Tylenol  (acetaminophen ) as needed for pain.  4. Support/Immobilization: Consider ankle brace or compression wrap for support and to reduce swelling. Walking boot if more severe instability or significant osteochondral defect.   The above assessment and management plan was discussed with the patient. The patient verbalized  understanding of and has agreed to the management plan. Patient is aware to call the clinic if they develop any new symptoms or if symptoms persist or worsen. Patient is aware when to return to the clinic for a follow-up visit. Patient educated on when it is appropriate to go to the emergency department.   Return if symptoms worsen or fail to improve.  Travonte Byard [place] Thompson, DNP [facility] [address] [place], [place] [address] [phone]  Note: This document was prepared by [employer] voice dictation technology and any errors that results  from this process are unintentional.

## 2024-01-10 ENCOUNTER — Ambulatory Visit (HOSPITAL_COMMUNITY)
Admission: RE | Admit: 2024-01-10 | Discharge: 2024-01-10 | Disposition: A | Discharge status: Home / Self Care | Payer: BC Managed Care – PPO | Source: Ambulatory Visit | Attending: Adult Health | Admitting: Adult Health

## 2024-01-10 ENCOUNTER — Other Ambulatory Visit (HOSPITAL_COMMUNITY): Payer: BC Managed Care – PPO

## 2024-01-10 DIAGNOSIS — R92333 Mammographic heterogeneous density, bilateral breasts: Secondary | ICD-10-CM | POA: Diagnosis not present

## 2024-01-10 DIAGNOSIS — N6323 Unspecified lump in the left breast, lower outer quadrant: Secondary | ICD-10-CM | POA: Diagnosis not present

## 2024-01-10 DIAGNOSIS — N63 Unspecified lump in unspecified breast: Secondary | ICD-10-CM | POA: Insufficient documentation

## 2024-01-10 DIAGNOSIS — N6325 Unspecified lump in the left breast, overlapping quadrants: Secondary | ICD-10-CM | POA: Diagnosis not present

## 2024-01-10 DIAGNOSIS — R928 Other abnormal and inconclusive findings on diagnostic imaging of breast: Secondary | ICD-10-CM | POA: Diagnosis not present

## 2024-05-18 ENCOUNTER — Ambulatory Visit (INDEPENDENT_AMBULATORY_CARE_PROVIDER_SITE_OTHER): Admitting: Family Medicine

## 2024-05-18 ENCOUNTER — Encounter: Payer: Self-pay | Admitting: Family Medicine

## 2024-05-18 VITALS — BP 108/66 | HR 73 | Temp 97.8°F | Ht 61.0 in | Wt 148.6 lb

## 2024-05-18 DIAGNOSIS — K921 Melena: Secondary | ICD-10-CM | POA: Diagnosis not present

## 2024-05-18 DIAGNOSIS — R197 Diarrhea, unspecified: Secondary | ICD-10-CM

## 2024-05-18 NOTE — Progress Notes (Signed)
 Subjective:  Patient ID: Melanie Rose, female    DOB: 12-Oct-1980, 43 y.o.   MRN: [id number]  Patient Care Team: Jolinda Norene HERO, DO as PCP - General (Family Medicine)   Chief Complaint:  Blood In Stools (After a 10 mile run)   HPI: Melanie Rose is a 43 y.o. female presenting on 05/18/2024 for Blood In Stools (After a 10 mile run)   Melanie Rose is a 43 year old female who presents with runner's diarrhea and rectal bleeding after a recent long run.  She is training for a half marathon and experiences diarrhea typically after runs longer than six miles, occurring about an hour post-run. Dehydration can exacerbate the diarrhea.  During her last long run of ten miles, she noticed pink mucus on the toilet paper approximately three times after the run, a new symptom as she had not experienced blood or mucus previously.  She reports normal bowel movements otherwise, with no issues of constipation or diarrhea outside of long runs. No associated symptoms such as nausea, vomiting, weight loss, significant pain, fever, or chills. She mentions gaining weight recently.          Relevant past medical, surgical, family, and social history reviewed and updated as indicated.  Allergies and medications reviewed and updated. Data reviewed: Chart in Epic.   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   Vaginal Pap smear, abnormal     Past Surgical History:  Procedure Laterality Date   BREAST BIOPSY Right 06/2019   Fibroadenoma   BREAST BIOPSY Left 2018   HAMARTOMATOUS LESION   BREAST EXCISIONAL BIOPSY Left 03/2018   HAMARTOMATOUS LESION   BREAST LUMPECTOMY WITH RADIOACTIVE SEED  LOCALIZATION Left 03/07/2018   Procedure: LEFT BREAST LUMPECTOMY WITH RADIOACTIVE SEED LOCALIZATION;  Surgeon: Vanderbilt Ned, MD;  Location: [facility];  Service: General;  Laterality: Left;   BREAST REDUCTION SURGERY Bilateral 11/05/2021   Procedure: BREAST REDUCTION WITH LIPOSUCTION;  Surgeon: Lowery Estefana RAMAN, DO;  Location: [facility];  Service: Plastics;  Laterality: Bilateral;  3 hrs   LAPAROSCOPIC TUBAL LIGATION Bilateral 01/16/2015   Procedure: LAPAROSCOPIC TUBAL LIGATION;  Surgeon: Ned Lares, MD;  Location: [facility];  Service: Gynecology;  Laterality: Bilateral;   TUBAL LIGATION      Social History   Socioeconomic History   Marital status: Married    Spouse name: Not on file   Number of children: 2   Years of education: Not on file   Highest education level: Some college, no degree  Occupational History   Not on file  Tobacco Use   Smoking status: Former    Current packs/day: 0.00    Average packs/day: 1 pack/day for 10.0 years (10.0 ttl pk-yrs)    Types: Cigarettes    Start date: 06/23/1999    Quit date: 06/22/2009    Years since quitting: 14.9   Smokeless tobacco: Never  Vaping Use   Vaping status: Never Used  Substance and Sexual Activity   Alcohol use: No   Drug use: No   Sexual activity: Yes    Birth control/protection: Surgical  Comment: tubal  Other Topics Concern   Not on file  Social History Narrative   Ms Goble is a pleasant female who is married and has 2 children.     Her son is 25 years old and her daughter is 34 years old.     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   Social Drivers of Corporate investment banker Strain: Low Risk  (05/17/2024)   Overall Financial Resource Strain (CARDIA)    Difficulty of Paying Living Expenses: Not hard at all  Food Insecurity: No Food Insecurity (05/17/2024)   Hunger Vital Sign    Worried About Running Out of Food in the Last  Year: Never true    Ran Out of Food in the Last Year: Never true  Transportation Needs: No Transportation Needs (05/17/2024)   PRAPARE - Administrator, Civil Service (Medical): No    Lack of Transportation (Non-Medical): No  Physical Activity: Sufficiently Active (05/17/2024)   Exercise Vital Sign    Days of Exercise per Week: 6 days    Minutes of Exercise per Session: 60 min  Stress: No Stress Concern Present (12/31/2023)   [employer] of Occupational Health - Occupational Stress Questionnaire    Feeling of Stress : Not at all  Social Connections: Moderately Integrated (05/17/2024)   Social Connection and Isolation Panel    Frequency of Communication with Friends and Family: More than three times a week    Frequency of Social Gatherings with Friends and Family: Once a week    Attends Religious Services: Never    Database administrator or Organizations: Yes    Attends Engineer, structural: More than 4 times per year    Marital Status: Married  Catering manager Violence: Not At Risk (07/25/2023)   Humiliation, Afraid, Rape, and Kick questionnaire    Fear of Current or Ex-Partner: No    Emotionally Abused: No    Physically Abused: No    Sexually Abused: No    Outpatient Encounter Medications as of 05/18/2024  Medication Sig   levothyroxine  (SYNTHROID ) 112 MCG tablet TAKE 1 TABLET DAILY BEFORE BREAKFAST   Iron , Ferrous Sulfate , 325 (65 Fe) MG TABS Take 325 mg by mouth in the morning and at bedtime. (Patient not taking: Reported on 05/18/2024)   Vitamin D , Ergocalciferol , (DRISDOL ) 1.25 MG (50000 UNIT) CAPS capsule Take 1 capsule (50,000 Units total) by mouth every 7 (seven) days. X12 weeks. Then start Vit D OTC 400IU daily. (Patient not taking: Reported on 05/18/2024)   No facility-administered encounter medications on file as of 05/18/2024.    Allergies  Allergen Reactions   Sulfa  Antibiotics Rash    Pertinent ROS per HPI, otherwise unremarkable       Objective:  BP 108/66   Pulse 73   Temp 97.8 F (36.6 C)   Ht 5' 1 (1.549 m)   Wt 148 lb 9.6 oz (67.4 kg)   SpO2 100%   BMI 28.08 kg/m    Wt Readings from Last 3 Encounters:  05/18/24 148 lb 9.6 oz (67.4 kg)  01/02/24 141 lb 9.6 oz (64.2 kg)  07/27/23 142 lb (64.4 kg)    Physical Exam Vitals and nursing note reviewed.  Constitutional:      General: She is not in acute distress.    Appearance: Normal appearance. She is not ill-appearing, toxic-appearing or diaphoretic.  HENT:     Head: Normocephalic  and atraumatic.     Mouth/Throat:     Mouth: Mucous membranes are moist.  Eyes:     Pupils: Pupils are equal, round, and reactive to light.  Cardiovascular:     Rate and Rhythm: Normal rate and regular rhythm.     Heart sounds: Normal heart sounds.  Pulmonary:     Effort: Pulmonary effort is normal.     Breath sounds: Normal breath sounds.  Abdominal:     General: Bowel sounds are normal.     Palpations: Abdomen is soft.     Tenderness: There is no abdominal tenderness.  Musculoskeletal:     Right lower leg: No edema.     Left lower leg: No edema.  Skin:    General: Skin is warm and dry.     Capillary Refill: Capillary refill takes less than 2 seconds.  Neurological:     General: No focal deficit present.     Mental Status: She is alert and oriented to person, place, and time.  Psychiatric:        Mood and Affect: Mood normal.        Behavior: Behavior normal.        Thought Content: Thought content normal.        Judgment: Judgment normal.      Results for orders placed or performed in visit on 10/06/23  CBC   Collection Time: 10/06/23 10:14 AM  Result Value Ref Range   WBC 5.1 3.4 - 10.8 x10E3/uL   RBC 4.27 3.77 - 5.28 x10E6/uL   Hemoglobin 13.1 11.1 - 15.9 g/dL   Hematocrit 58.7 65.9 - 46.6 %   MCV 97 79 - 97 fL   MCH 30.7 26.6 - 33.0 pg   MCHC 31.8 31.5 - 35.7 g/dL   RDW 81.7 (H) 88.2 - 84.5 %   Platelets 247 150 - 450 x10E3/uL  Iron , TIBC and  Ferritin Panel   Collection Time: 10/06/23 10:14 AM  Result Value Ref Range   Total Iron  Binding Capacity 304 250 - 450 ug/dL   UIBC 74 (L) 868 - 574 ug/dL   Iron  230 (H) 27 - 159 ug/dL   Iron  Saturation 76 (HH) 15 - 55 %   Ferritin 32 15 - 150 ng/mL       Pertinent labs & imaging results that were available during my care of the patient were reviewed by me and considered in my medical decision making.  Assessment & Plan:  Beatric was seen today for blood in stools.  Diagnoses and all orders for this visit:  Blood in stool -     CBC with Differential/Platelet -     Fecal occult blood, imunochemical; Future -     BMP8+EGFR  Bloody diarrhea -     CBC with Differential/Platelet -     Fecal occult blood, imunochemical; Future -     BMP8+EGFR       Runner's diarrhea with rectal bleeding Intermittent runner's diarrhea post long runs (>6 miles) with recent rectal bleeding (pink mucous on toilet paper). Differential includes tissue irritation from diarrhea and possible hemorrhoids. No nausea, vomiting, weight loss, significant pain, fever, chills, or family history of cancers. Normal bowel movements otherwise. - Order CBC to assess blood count. - Order CMP to evaluate electrolyte levels. - Provide fecal occult blood test card for home collection to check for blood in stool.          Continue all other maintenance medications.  Follow up plan: Return if  symptoms worsen or fail to improve.   Continue healthy lifestyle choices, including diet (rich in fruits, vegetables, and lean proteins, and low in salt and simple carbohydrates) and exercise (at least 30 minutes of moderate physical activity daily).   The above assessment and management plan was discussed with the patient. The patient verbalized understanding of and has agreed to the management plan. Patient is aware to call the clinic if they develop any new symptoms or if symptoms persist or worsen. Patient is aware when to  return to the clinic for a follow-up visit. Patient educated on when it is appropriate to go to the emergency department.   Rosaline Bruns, FNP-C [facility] [phone]

## 2024-05-19 ENCOUNTER — Ambulatory Visit: Payer: Self-pay | Admitting: Family Medicine

## 2024-05-19 LAB — CBC WITH DIFFERENTIAL/PLATELET
Basophils Absolute: 0.1 x10E3/uL (ref 0.0–0.2)
Basos: 1 %
EOS (ABSOLUTE): 0.3 x10E3/uL (ref 0.0–0.4)
Eos: 5 %
Hematocrit: 40 % (ref 34.0–46.6)
Hemoglobin: 12.6 g/dL (ref 11.1–15.9)
Immature Grans (Abs): 0 x10E3/uL (ref 0.0–0.1)
Immature Granulocytes: 0 %
Lymphocytes Absolute: 2.1 x10E3/uL (ref 0.7–3.1)
Lymphs: 32 %
MCH: 28.6 pg (ref 26.6–33.0)
MCHC: 31.5 g/dL (ref 31.5–35.7)
MCV: 91 fL (ref 79–97)
Monocytes Absolute: 0.7 x10E3/uL (ref 0.1–0.9)
Monocytes: 11 %
Neutrophils Absolute: 3.4 x10E3/uL (ref 1.4–7.0)
Neutrophils: 51 %
Platelets: 293 x10E3/uL (ref 150–450)
RBC: 4.41 x10E6/uL (ref 3.77–5.28)
RDW: 13.1 % (ref 11.7–15.4)
WBC: 6.5 x10E3/uL (ref 3.4–10.8)

## 2024-05-19 LAB — BMP8+EGFR
BUN/Creatinine Ratio: 15 (ref 9–23)
BUN: 14 mg/dL (ref 6–24)
CO2: 21 mmol/L (ref 20–29)
Calcium: 9.7 mg/dL (ref 8.7–10.2)
Chloride: 104 mmol/L (ref 96–106)
Creatinine, Ser: 0.94 mg/dL (ref 0.57–1.00)
Glucose: 79 mg/dL (ref 70–99)
Potassium: 4.6 mmol/L (ref 3.5–5.2)
Sodium: 138 mmol/L (ref 134–144)
eGFR: 79 mL/min/1.73 (ref >59)

## 2024-06-21 DIAGNOSIS — L814 Other melanin hyperpigmentation: Secondary | ICD-10-CM | POA: Diagnosis not present

## 2024-06-21 DIAGNOSIS — D229 Melanocytic nevi, unspecified: Secondary | ICD-10-CM | POA: Diagnosis not present

## 2024-06-21 DIAGNOSIS — L821 Other seborrheic keratosis: Secondary | ICD-10-CM | POA: Diagnosis not present

## 2024-06-21 DIAGNOSIS — L578 Other skin changes due to chronic exposure to nonionizing radiation: Secondary | ICD-10-CM | POA: Diagnosis not present

## 2024-07-04 ENCOUNTER — Other Ambulatory Visit: Payer: Self-pay | Admitting: Family Medicine

## 2024-07-04 DIAGNOSIS — E890 Postprocedural hypothyroidism: Secondary | ICD-10-CM

## 2024-07-27 ENCOUNTER — Encounter: Payer: Self-pay | Admitting: Family Medicine

## 2024-07-27 ENCOUNTER — Ambulatory Visit: Payer: BC Managed Care – PPO | Admitting: Family Medicine

## 2024-07-27 VITALS — BP 98/72 | HR 66 | Temp 97.4°F | Ht 61.0 in | Wt 146.8 lb

## 2024-07-27 DIAGNOSIS — E559 Vitamin D deficiency, unspecified: Secondary | ICD-10-CM | POA: Diagnosis not present

## 2024-07-27 DIAGNOSIS — E7800 Pure hypercholesterolemia, unspecified: Secondary | ICD-10-CM

## 2024-07-27 DIAGNOSIS — D508 Other iron deficiency anemias: Secondary | ICD-10-CM

## 2024-07-27 DIAGNOSIS — Z0001 Encounter for general adult medical examination with abnormal findings: Secondary | ICD-10-CM

## 2024-07-27 DIAGNOSIS — Z0000 Encounter for general adult medical examination without abnormal findings: Secondary | ICD-10-CM

## 2024-07-27 DIAGNOSIS — Z23 Encounter for immunization: Secondary | ICD-10-CM

## 2024-07-27 DIAGNOSIS — E890 Postprocedural hypothyroidism: Secondary | ICD-10-CM

## 2024-07-27 LAB — LIPID PANEL

## 2024-07-27 MED ORDER — LEVOTHYROXINE SODIUM 112 MCG PO TABS: 112.0000 ug | ORAL_TABLET | Freq: Every day | ORAL | 4 refills | Status: DC

## 2024-07-27 MED ORDER — VITAMIN D (ERGOCALCIFEROL) 1.25 MG (50000 UNIT) PO CAPS
50000.0000 [IU] | ORAL_CAPSULE | ORAL | 0 refills | Status: DC
Start: 2024-07-27 — End: 2024-07-27

## 2024-07-27 NOTE — Progress Notes (Signed)
 Melanie Rose is a 43 y.o. female presents to office today for annual physical exam examination.    She reports that she is doing fairly well.  She is not currently taking iron  but would like to have iron  levels checked because when she was doing the run recently she felt a little bit more easily fatigued.  She continues to stay very physically active and follows a very balanced diet.  She is compliant with 400 international units of vitamin D  daily and compliant with her Synthroid .  Denies any medication changes.  No tremors, heart palpitations, changes in bowel habits etc.  Continues to get regular dental and vision checks and those have been within normal range.  Amenable to influenza vaccination today.  Denies any abnormal bleeding except for sometimes a slightly prolonged menstrual cycle.  She has appointment with OB/GYN after the new year to discuss this further.  She notes some lability in mood immediately prior to her menstrual cycle.  She is questioning perimenopause but does not want to be medicated if she can avoid it.  Marital status: married, Substance use: none Health Maintenance Due  Topic Date Due   HPV VACCINES (1 - 3-dose SCDM series) Never done   COVID-19 Vaccine (4 - 2025-26 season) 05/07/2024    Immunization History  Administered Date(s) Administered   Hepatitis B, PED/ADOLESCENT 06/07/1995, 07/19/1995, 12/13/1995   Influenza,inj,Quad PF,6+ Mos 06/19/2018, 05/30/2019, 06/23/2020, 07/20/2021, 06/24/2022   Influenza,trivalent, recombinat, inj, PF 07/03/2013   Influenza-Unspecified 07/03/2013   Moderna Sars-Covid-2 Vaccination 11/29/2019, 01/02/2020, 07/22/2020   Td 06/24/2022   Tdap 06/24/2011   Past Medical History:  Diagnosis Date   Abnormal Pap smear    Breast mass in female    Left   Chronic kidney disease    kidney stones   Constipation    Fibromyalgia    Generalized headaches    GERD (gastroesophageal reflux disease)    Hemorrhoids    History of kidney  stones    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   Vaginal Pap smear, abnormal    Social History   Socioeconomic History   Marital status: Married    Spouse name: Not on file   Number of children: 2   Years of education: Not on file   Highest education level: Some college, no degree  Occupational History   Not on file  Tobacco Use   Smoking status: Former    Current packs/day: 0.00    Average packs/day: 1 pack/day for 10.0 years (10.0 ttl pk-yrs)    Types: Cigarettes    Start date: 06/23/1999    Quit date: 06/22/2009    Years since quitting: 15.1   Smokeless tobacco: Never  Vaping Use   Vaping status: Never Used  Substance and Sexual Activity   Alcohol use: No   Drug use: No   Sexual activity: Yes    Birth control/protection: Surgical    Comment: tubal  Other Topics Concern   Not on file  Social History Narrative   Ms Gaige is a pleasant female who is married and has 2 children.     Her son is 44 years old and her daughter is 13 years old.     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   Social Drivers of Corporate Investment Banker Strain: Low Risk  (07/26/2024)   Overall Financial Resource Strain (CARDIA)    Difficulty of Paying Living Expenses: Not  hard at all  Food Insecurity: No Food Insecurity (07/26/2024)   Hunger Vital Sign    Worried About Running Out of Food in the Last Year: Never true    Ran Out of Food in the Last Year: Never true  Transportation Needs: No Transportation Needs (07/26/2024)   PRAPARE - Administrator, Civil Service (Medical): No    Lack of Transportation (Non-Medical): No  Physical Activity: Unknown (07/26/2024)   Exercise Vital Sign    Days of Exercise per Week: 6 days    Minutes of Exercise per Session: Not on file  Stress: No Stress Concern Present (07/26/2024)   [employer] of Occupational Health - Occupational Stress Questionnaire     Feeling of Stress: Not at all  Social Connections: Moderately Integrated (07/26/2024)   Social Connection and Isolation Panel    Frequency of Communication with Friends and Family: More than three times a week    Frequency of Social Gatherings with Friends and Family: Once a week    Attends Religious Services: Never    Database Administrator or Organizations: Yes    Attends Engineer, Structural: More than 4 times per year    Marital Status: Married  Catering Manager Violence: Not At Risk (07/25/2023)   Humiliation, Afraid, Rape, and Kick questionnaire    Fear of Current or Ex-Partner: No    Emotionally Abused: No    Physically Abused: No    Sexually Abused: No   Past Surgical History:  Procedure Laterality Date   BREAST BIOPSY Right 06/2019   Fibroadenoma   BREAST BIOPSY Left 2018   HAMARTOMATOUS LESION   BREAST EXCISIONAL BIOPSY Left 03/2018   HAMARTOMATOUS LESION   BREAST LUMPECTOMY WITH RADIOACTIVE SEED LOCALIZATION Left 03/07/2018   Procedure: LEFT BREAST LUMPECTOMY WITH RADIOACTIVE SEED LOCALIZATION;  Surgeon: Vanderbilt Ned, MD;  Location: [facility];  Service: General;  Laterality: Left;   BREAST REDUCTION SURGERY Bilateral 11/05/2021   Procedure: BREAST REDUCTION WITH LIPOSUCTION;  Surgeon: Lowery Estefana RAMAN, DO;  Location: [facility];  Service: Plastics;  Laterality: Bilateral;  3 hrs   LAPAROSCOPIC TUBAL LIGATION Bilateral 01/16/2015   Procedure: LAPAROSCOPIC TUBAL LIGATION;  Surgeon: Ned Lares, MD;  Location: [facility];  Service: Gynecology;  Laterality: Bilateral;   TUBAL LIGATION     Family History  Problem Relation Age of Onset   Hypertension Mother    Hypertension Father    Heart disease Maternal Grandfather    Cancer Paternal Grandfather     Current Outpatient Medications:    Iron , Ferrous Sulfate , 325 (65 Fe) MG TABS, Take 325 mg by mouth in the morning and at bedtime. (Patient not taking: Reported on 05/18/2024), Disp: 180 tablet,  Rfl: 0   levothyroxine  (SYNTHROID ) 112 MCG tablet, TAKE 1 TABLET DAILY BEFORE BREAKFAST, Disp: 90 tablet, Rfl: 0   Vitamin D , Ergocalciferol , (DRISDOL ) 1.25 MG (50000 UNIT) CAPS capsule, Take 1 capsule (50,000 Units total) by mouth every 7 (seven) days. X12 weeks. Then start Vit D OTC 400IU daily. (Patient not taking: Reported on 05/18/2024), Disp: 12 capsule, Rfl: 0  Allergies  Allergen Reactions   Sulfa  Antibiotics Rash     ROS: Review of Systems Pertinent items noted in HPI and remainder of comprehensive ROS otherwise negative.    Physical exam BP 98/72   Pulse 66   Temp (!) 97.4 F (36.3 C) (Temporal)   Ht 5' 1 (1.549 m)   Wt 146 lb 12.8 oz (66.6 kg)   LMP  07/18/2024   SpO2 99%   BMI 27.74 kg/m  General appearance: alert, cooperative, appears stated age, and no distress Head: Normocephalic, without obvious abnormality, atraumatic Eyes: negative findings: lids and lashes normal, conjunctivae and sclerae normal, corneas clear, and pupils equal, round, reactive to light and accomodation Ears: normal TM's and external ear canals both ears Nose: Nares normal. Septum midline. Mucosa normal. No drainage or sinus tenderness. Throat: lips, mucosa, and tongue normal; teeth and gums normal Neck: no adenopathy, no carotid bruit, supple, symmetrical, trachea midline, and thyroid  not enlarged, symmetric, no tenderness/mass/nodules Back: symmetric, no curvature. ROM normal. No CVA tenderness. Lungs: clear to auscultation bilaterally Heart: regular rate and rhythm, S1, S2 normal, no murmur, click, rub or gallop Abdomen: soft, non-tender; bowel sounds normal; no masses,  no organomegaly Extremities: extremities normal, atraumatic, no cyanosis or edema Pulses: 2+ and symmetric Skin: Skin color, texture, turgor normal. No rashes or lesions Lymph nodes: No supraclavicular or anterior cervical lymph node enlargement Neurologic: Alert and oriented X 3, normal strength and tone. Normal symmetric  reflexes. Normal coordination and gait      07/27/2024    7:58 AM 05/18/2024    8:43 AM 07/27/2023    7:53 AM  Depression screen PHQ 2/9  Decreased Interest 0 0 0  Down, Depressed, Hopeless 0 0 0  PHQ - 2 Score 0 0 0  Altered sleeping  0 0  Tired, decreased energy  0 0  Change in appetite  0 0  Feeling bad or failure about yourself   0 0  Trouble concentrating  0 0  Moving slowly or fidgety/restless  0 0  Suicidal thoughts  0 0  PHQ-9 Score  0  0   Difficult doing work/chores  Not difficult at all Not difficult at all     Data saved with a previous flowsheet row definition      05/18/2024    8:43 AM 07/27/2023    7:54 AM 07/25/2023    8:25 AM 02/23/2023   11:52 AM  GAD 7 : Generalized Anxiety Score  Nervous, Anxious, on Edge 0 0 0 0  Control/stop worrying 0 0 0 0  Worry too much - different things 0 0 0 0  Trouble relaxing 0 0 0 0  Restless 0 0 0 0  Easily annoyed or irritable 0 0 0 0  Afraid - awful might happen 0 0 0 0  Total GAD 7 Score 0 0 0 0  Anxiety Difficulty Not difficult at all Not difficult at all  Not difficult at all    Recent Results (from the past 2160 hours)  CBC with Differential/Platelet     Status: None   Collection Time: 05/18/24  8:59 AM  Result Value Ref Range   WBC 6.5 3.4 - 10.8 x10E3/uL   RBC 4.41 3.77 - 5.28 x10E6/uL   Hemoglobin 12.6 11.1 - 15.9 g/dL   Hematocrit 59.9 65.9 - 46.6 %   MCV 91 79 - 97 fL   MCH 28.6 26.6 - 33.0 pg   MCHC 31.5 31.5 - 35.7 g/dL   RDW 86.8 88.2 - 84.5 %   Platelets 293 150 - 450 x10E3/uL   Neutrophils 51 Not Estab. %   Lymphs 32 Not Estab. %   Monocytes 11 Not Estab. %   Eos 5 Not Estab. %   Basos 1 Not Estab. %   Neutrophils Absolute 3.4 1.4 - 7.0 x10E3/uL   Lymphocytes Absolute 2.1 0.7 - 3.1 x10E3/uL   Monocytes Absolute  0.7 0.1 - 0.9 x10E3/uL   EOS (ABSOLUTE) 0.3 0.0 - 0.4 x10E3/uL   Basophils Absolute 0.1 0.0 - 0.2 x10E3/uL   Immature Granulocytes 0 Not Estab. %   Immature Grans (Abs) 0.0 0.0 - 0.1  x10E3/uL  BMP8+EGFR     Status: None   Collection Time: 05/18/24  8:59 AM  Result Value Ref Range   Glucose 79 70 - 99 mg/dL   BUN 14 6 - 24 mg/dL   Creatinine, Ser 9.05 0.57 - 1.00 mg/dL   eGFR 79 >40 fO/fpw/8.26   BUN/Creatinine Ratio 15 9 - 23   Sodium 138 134 - 144 mmol/L   Potassium 4.6 3.5 - 5.2 mmol/L   Chloride 104 96 - 106 mmol/L   CO2 21 20 - 29 mmol/L   Calcium 9.7 8.7 - 10.2 mg/dL     Assessment/ Plan: Corina JAYSON Molt here for annual physical exam.   Annual physical exam  Hypothyroidism following radioiodine therapy - Plan: CMP14+EGFR, TSH + free T4, levothyroxine  (SYNTHROID ) 112 MCG tablet  Iron  deficiency anemia secondary to inadequate dietary iron  intake - Plan: CBC with Differential, Iron , TIBC and Ferritin Panel  Vitamin D  deficiency - Plan: CMP14+EGFR, VITAMIN D  25 Hydroxy (Vit-D Deficiency, Fractures), DISCONTINUED: Vitamin D , Ergocalciferol , (DRISDOL ) 1.25 MG (50000 UNIT) CAPS capsule  Pure hypercholesterolemia - Plan: CMP14+EGFR, Lipid Panel  Encounter for immunization - Plan: Flu vaccine trivalent PF, 6mos and older(Flulaval,Afluria,Fluarix,Fluzone)   Fasting labs collected.  Clinically euthymic.  Check iron  and CBC given history of iron  deficiency anemia and some reports of fatigability.  May need to consider restarting iron  supplementation.  She will continue OTC vitamin D  but we will check her vitamin D  levels given history of deficiency  Influenza vaccination administered.  Counseled on healthy lifestyle choices, including diet (rich in fruits, vegetables and lean meats and low in salt and simple carbohydrates) and exercise (at least 30 minutes of moderate physical activity daily).  Patient to follow up 1 year for CPE  Sherrell Farish M. Jolinda, DO

## 2024-07-27 NOTE — Patient Instructions (Addendum)
 I'm so proud of how far you have come.  You've brought your BMI down OUT OF obese range from morbid obesity over the last few years.  That deserves recognition.  You're doing awesome.  Keep up the good work!

## 2024-07-28 LAB — CMP14+EGFR
ALT: 22 IU/L (ref 0–32)
AST: 22 IU/L (ref 0–40)
Albumin: 4.2 g/dL (ref 3.9–4.9)
Alkaline Phosphatase: 80 IU/L (ref 41–116)
BUN/Creatinine Ratio: 11 (ref 9–23)
BUN: 10 mg/dL (ref 6–24)
Bilirubin Total: 0.4 mg/dL (ref 0.0–1.2)
CO2: 23 mmol/L (ref 20–29)
Calcium: 9.9 mg/dL (ref 8.7–10.2)
Chloride: 100 mmol/L (ref 96–106)
Creatinine, Ser: 0.89 mg/dL (ref 0.57–1.00)
Globulin, Total: 2.5 g/dL (ref 1.5–4.5)
Glucose: 98 mg/dL (ref 70–99)
Potassium: 4.9 mmol/L (ref 3.5–5.2)
Sodium: 137 mmol/L (ref 134–144)
Total Protein: 6.7 g/dL (ref 6.0–8.5)
eGFR: 84 mL/min/1.73 (ref >59)

## 2024-07-28 LAB — CBC WITH DIFFERENTIAL/PLATELET
Basophils Absolute: 0.1 x10E3/uL (ref 0.0–0.2)
Basos: 1 %
EOS (ABSOLUTE): 0.2 x10E3/uL (ref 0.0–0.4)
Eos: 4 %
Hematocrit: 39.5 % (ref 34.0–46.6)
Hemoglobin: 12.4 g/dL (ref 11.1–15.9)
Immature Grans (Abs): 0 x10E3/uL (ref 0.0–0.1)
Immature Granulocytes: 0 %
Lymphocytes Absolute: 2 x10E3/uL (ref 0.7–3.1)
Lymphs: 36 %
MCH: 28 pg (ref 26.6–33.0)
MCHC: 31.4 g/dL — ABNORMAL LOW (ref 31.5–35.7)
MCV: 89 fL (ref 79–97)
Monocytes Absolute: 0.5 x10E3/uL (ref 0.1–0.9)
Monocytes: 10 %
Neutrophils Absolute: 2.6 x10E3/uL (ref 1.4–7.0)
Neutrophils: 49 %
Platelets: 317 x10E3/uL (ref 150–450)
RBC: 4.43 x10E6/uL (ref 3.77–5.28)
RDW: 13.8 % (ref 11.7–15.4)
WBC: 5.4 x10E3/uL (ref 3.4–10.8)

## 2024-07-28 LAB — TSH+FREE T4
Free T4: 1.31 ng/dL (ref 0.82–1.77)
TSH: 3.4 u[IU]/mL (ref 0.450–4.500)

## 2024-07-28 LAB — IRON,TIBC AND FERRITIN PANEL
Ferritin: 11 ng/mL — AB (ref 15–150)
Iron Saturation: 14 % — AB (ref 15–55)
Iron: 59 ug/dL (ref 27–159)
Total Iron Binding Capacity: 418 ug/dL (ref 250–450)
UIBC: 359 ug/dL (ref 131–425)

## 2024-07-28 LAB — LIPID PANEL
Cholesterol, Total: 188 mg/dL (ref 100–199)
HDL: 80 mg/dL (ref >39)
LDL CALC COMMENT:: 2.4 ratio (ref 0.0–4.4)
LDL Chol Calc (NIH): 97 mg/dL (ref 0–99)
Triglycerides: 57 mg/dL (ref 0–149)
VLDL Cholesterol Cal: 11 mg/dL (ref 5–40)

## 2024-07-28 LAB — VITAMIN D 25 HYDROXY (VIT D DEFICIENCY, FRACTURES): Vit D, 25-Hydroxy: 33.4 ng/mL (ref 30.0–100.0)

## 2024-07-30 ENCOUNTER — Ambulatory Visit: Payer: Self-pay | Admitting: Family Medicine

## 2024-09-18 ENCOUNTER — Ambulatory Visit: Admitting: Adult Health

## 2024-09-18 ENCOUNTER — Other Ambulatory Visit (HOSPITAL_COMMUNITY)
Admission: RE | Admit: 2024-09-18 | Discharge: 2024-09-18 | Disposition: A | Discharge status: Home / Self Care | Source: Ambulatory Visit | Attending: Obstetrics & Gynecology | Admitting: Obstetrics & Gynecology

## 2024-09-18 ENCOUNTER — Encounter: Payer: Self-pay | Admitting: Adult Health

## 2024-09-18 VITALS — BP 100/68 | HR 80 | Ht 60.0 in | Wt 147.0 lb

## 2024-09-18 DIAGNOSIS — L910 Hypertrophic scar: Secondary | ICD-10-CM

## 2024-09-18 DIAGNOSIS — Z1151 Encounter for screening for human papillomavirus (HPV): Secondary | ICD-10-CM

## 2024-09-18 DIAGNOSIS — Z01419 Encounter for gynecological examination (general) (routine) without abnormal findings: Secondary | ICD-10-CM | POA: Insufficient documentation

## 2024-09-18 DIAGNOSIS — Z9889 Other specified postprocedural states: Secondary | ICD-10-CM | POA: Diagnosis not present

## 2024-09-18 DIAGNOSIS — N926 Irregular menstruation, unspecified: Secondary | ICD-10-CM | POA: Diagnosis not present

## 2024-09-18 DIAGNOSIS — E041 Nontoxic single thyroid nodule: Secondary | ICD-10-CM | POA: Insufficient documentation

## 2024-09-18 NOTE — Progress Notes (Signed)
 Patient ID: Melanie Rose, female   DOB: 1981/03/06, 44 y.o.   MRN: [id number] History of Present Illness: Melanie Rose is a 44 year old white female, married, G2P2002 in for a well woman gyn exam and pap.  PCP is Dr Jolinda   Current Medications, Allergies, Past Medical History, Past Surgical History, Family History and Social History were reviewed in [employer] electronic medical record.     Review of Systems: Patient denies any headaches, hearing loss, fatigue, blurred vision, shortness of breath, chest pain, abdominal pain, problems with bowel movements, urination, or intercourse. No joint pain or mood swings.  She had some rectal bleeding after running a 10 mile race, and had it checked by PCP Periods are irregular, cycle varied to 31 days one month and then one month she bleed for 11 days Her mom was PM at 46    Physical Exam:BP 100/68 (BP Location: Right Arm, Patient Position: Sitting, Cuff Size: Normal)   Pulse 80   Ht 5' (1.524 m)   Wt 147 lb (66.7 kg)   LMP 09/08/2024 (Exact Date)   BMI 28.71 kg/m   General:  Well developed, well nourished, no acute distress Skin:  Warm and dry Neck:  Midline trachea,  thyroid  nodule felt on right, good ROM, no lymphadenopathy Lungs; Clear to auscultation bilaterally Breast:  No dominant palpable mass, retraction, or nipple discharge, she does have keloid scarring after breast reduction  Cardiovascular: Regular rate and rhythm Abdomen:  Soft, non tender, no hepatosplenomegaly Pelvic:  External genitalia is normal in appearance, no lesions.  The vagina is normal in appearance. Urethra has no lesions or masses. The cervix is bulbous. Pap with HR HPV genotyping performed. Uterus is felt to be normal size, shape, and contour.  No adnexal masses or tenderness noted.Bladder is non tender, no masses felt. Rectal: Deferred Extremities/musculoskeletal:  No swelling or varicosities noted, no clubbing or cyanosis Psych:  No mood changes, alert and  cooperative,seems happy AA is 0 Fall risk is low    09/18/2024   10:54 AM 07/27/2024    7:58 AM 05/18/2024    8:43 AM  Depression screen PHQ 2/9  Decreased Interest 0 0 0  Down, Depressed, Hopeless 0 0 0  PHQ - 2 Score 0 0 0  Altered sleeping 0  0  Tired, decreased energy 0  0  Change in appetite 0  0  Feeling bad or failure about yourself  0  0  Trouble concentrating 0  0  Moving slowly or fidgety/restless 0  0  Suicidal thoughts 0  0  PHQ-9 Score 0  0   Difficult doing work/chores   Not difficult at all     Data saved with a previous flowsheet row definition       09/18/2024   10:54 AM 05/18/2024    8:43 AM 07/27/2023    7:54 AM 07/25/2023    8:25 AM  GAD 7 : Generalized Anxiety Score  Nervous, Anxious, on Edge 0 0 0 0  Control/stop worrying 0 0 0 0  Worry too much - different things 0 0 0 0  Trouble relaxing 0 0 0 0  Restless 0 0 0 0  Easily annoyed or irritable 0 0 0 0  Afraid - awful might happen 0 0 0 0  Total GAD 7 Score 0 0 0 0  Anxiety Difficulty  Not difficult at all Not difficult at all     Upstream - 09/18/24 1052       Pregnancy  Intention Screening   Does the patient want to become pregnant in the next year? No    Does the patient's partner want to become pregnant in the next year? No    Would the patient like to discuss contraceptive options today? No      Contraception Wrap Up   Current Method Female Sterilization    End Method Female Sterilization    Contraception Counseling Provided No         Examination chaperoned by Clarita Salt LPN     Impression and plan: 1. Encounter for gynecological examination with Papanicolaou smear of cervix (Primary) Pap sent Pap in 3 years if negative Physical in 1 year Labs with PCP Mammogram was 01/10/24  - Cytology - PAP( [facility])  2. Keloid scar   3. Irregular periods Periods are irregular, cycle varied to 31 days one month and then one month she bleed for 11 days Discussed could be  perimenopause, review handout   4. History of bilateral breast reduction surgery  5. Thyroid  nodule Felt nodule on right Will get thyroid  US  at [facility] 09/25/24 at 9:30 am to assess - US  THYROID ; Future

## 2024-09-21 ENCOUNTER — Ambulatory Visit: Payer: Self-pay | Admitting: Adult Health

## 2024-09-21 DIAGNOSIS — E041 Nontoxic single thyroid nodule: Secondary | ICD-10-CM

## 2024-09-21 LAB — CYTOLOGY - PAP
Adequacy: ABSENT
Comment: NEGATIVE
Diagnosis: NEGATIVE
High risk HPV: NEGATIVE

## 2024-09-25 ENCOUNTER — Ambulatory Visit (HOSPITAL_COMMUNITY)
Admission: RE | Admit: 2024-09-25 | Discharge: 2024-09-25 | Disposition: A | Discharge status: Home / Self Care | Source: Ambulatory Visit | Attending: Adult Health | Admitting: Adult Health

## 2024-09-25 DIAGNOSIS — E041 Nontoxic single thyroid nodule: Secondary | ICD-10-CM | POA: Diagnosis present

## 2024-10-02 ENCOUNTER — Other Ambulatory Visit: Payer: Self-pay | Admitting: Family Medicine

## 2024-10-02 DIAGNOSIS — E890 Postprocedural hypothyroidism: Secondary | ICD-10-CM

## 2025-07-29 ENCOUNTER — Encounter: Admitting: Family Medicine
